# Patient Record
Sex: Male | Born: 1940 | Race: White | Hispanic: No | Marital: Married | Smoking: Never smoker
Health system: Southern US, Community
[De-identification: ages and names within clinical notes are randomized; demographics above are authoritative.]

## PROBLEM LIST (undated history)

## (undated) DIAGNOSIS — J310 Chronic rhinitis: Secondary | ICD-10-CM

## (undated) DIAGNOSIS — K469 Unspecified abdominal hernia without obstruction or gangrene: Secondary | ICD-10-CM

## (undated) DIAGNOSIS — L309 Dermatitis, unspecified: Secondary | ICD-10-CM

## (undated) DIAGNOSIS — E785 Hyperlipidemia, unspecified: Secondary | ICD-10-CM

## (undated) DIAGNOSIS — E039 Hypothyroidism, unspecified: Secondary | ICD-10-CM

## (undated) HISTORY — PX: SHOULDER SURGERY: SHX246

## (undated) HISTORY — PX: KNEE ARTHROSCOPY: SUR90

## (undated) HISTORY — DX: Unspecified abdominal hernia without obstruction or gangrene: K46.9

## (undated) HISTORY — DX: Chronic rhinitis: J31.0

## (undated) HISTORY — PX: MECKEL DIVERTICULUM EXCISION: SHX314

## (undated) HISTORY — DX: Hyperlipidemia, unspecified: E78.5

## (undated) HISTORY — DX: Hypothyroidism, unspecified: E03.9

## (undated) HISTORY — PX: OTHER SURGICAL HISTORY: SHX169

## (undated) HISTORY — DX: Dermatitis, unspecified: L30.9

## (undated) HISTORY — PX: BUNIONECTOMY: SHX129

---

## 1998-02-26 ENCOUNTER — Emergency Department (HOSPITAL_COMMUNITY): Admission: EM | Admit: 1998-02-26 | Discharge: 1998-02-26 | Payer: Self-pay | Admitting: Emergency Medicine

## 2002-01-25 ENCOUNTER — Encounter: Payer: Self-pay | Admitting: Orthopaedic Surgery

## 2002-01-25 ENCOUNTER — Ambulatory Visit (HOSPITAL_COMMUNITY): Admission: RE | Admit: 2002-01-25 | Discharge: 2002-01-25 | Payer: Self-pay | Admitting: Orthopaedic Surgery

## 2002-03-23 ENCOUNTER — Ambulatory Visit (HOSPITAL_BASED_OUTPATIENT_CLINIC_OR_DEPARTMENT_OTHER): Admission: RE | Admit: 2002-03-23 | Discharge: 2002-03-23 | Payer: Self-pay | Admitting: Orthopaedic Surgery

## 2003-09-27 ENCOUNTER — Encounter: Admission: RE | Admit: 2003-09-27 | Discharge: 2003-09-27 | Payer: Self-pay | Admitting: Internal Medicine

## 2003-10-25 ENCOUNTER — Encounter: Admission: RE | Admit: 2003-10-25 | Discharge: 2003-10-25 | Payer: Self-pay | Admitting: Internal Medicine

## 2003-10-27 ENCOUNTER — Encounter: Admission: RE | Admit: 2003-10-27 | Discharge: 2003-10-27 | Payer: Self-pay | Admitting: Internal Medicine

## 2007-07-30 ENCOUNTER — Encounter: Admission: RE | Admit: 2007-07-30 | Discharge: 2007-07-30 | Payer: Self-pay | Admitting: Internal Medicine

## 2008-04-26 ENCOUNTER — Encounter (HOSPITAL_COMMUNITY): Admission: RE | Admit: 2008-04-26 | Discharge: 2008-05-10 | Payer: Self-pay | Admitting: Internal Medicine

## 2010-09-28 NOTE — Op Note (Signed)
NAME:  Devon Jacobs, Devon Jacobs NO.:  [id number]   MEDICAL RECORD NO.:  [id number]                   PATIENT TYPE:   LOCATION:                                       FACILITY:   PHYSICIAN:  Lubertha Basque. Jerl Santos, M.D.             DATE OF BIRTH:  11-03-1940   DATE OF PROCEDURE:  03/23/2002  DATE OF DISCHARGE:                                 OPERATIVE REPORT   PREOPERATIVE DIAGNOSES:  1. Left knee torn medial and torn lateral meniscus.  2. Left knee degenerative joint disease.   POSTOPERATIVE DIAGNOSES:  1. Left knee torn medial and torn lateral meniscus.  2. Left knee degenerative joint disease.   PROCEDURE:  1. Left knee partial medial and partial lateral meniscectomies.  2. Left knee chondroplasty, all three compartments.   ANESTHESIA:  Knee block MAC.   ATTENDING SURGEON:  Lubertha Basque. Jerl Santos, M.D.   ASSISTANT:  Lindwood Qua, P.A.   INDICATIONS FOR PROCEDURE:  The patient is a 70 year-old man with a long  history of left knee pain.  This has persisted despite oral anti-  inflammatories and injections.  He has undergone an MRI scan which shows  complex tears of both menisci and significant degenerative changes in all  three compartments.  With pain that limits his activity and rest he is  offered an arthroscopy.  The procedure was discussed with the patient and  informed operative consent was obtained after discussing the possible  complications of reaction to anesthesia, infection and the probable need for  additional surgery.   DESCRIPTION OF PROCEDURE:  The patient was taken to the operating suite  where a knee block was applied without difficulty.  He was positioned supine  and prepped and draped in normal sterile fashion.  After the administration  of IV antibiotics an arthroscopy of the left knee was performed through a  total of two inferiori portals.  The patient stayed awake throughout the  case and observed the entire operation. In the  suprapatellar pouch he had  some cartilaginous loose bodies removed.  The patellofemoral joint was  notable for some grade 4 degeneration mostly involving the patellar surface.  A thorough chondroplasty was done here.  In the medial compartment he had a  posterior and medial horn medial meniscus tear addressed with a 5% partial  medial meniscectomy back to stable tissues.  Grade 4 change and grade 3  change with some large loose flaps which required chondroplasty mostly on  the medial femoral condyle.  He had some displaced meniscal fragments in the  notch which were removed.  The ACL below appeared to be relatively intact.  A large spur on the anterior aspect of the intertrochlear area.  In the  lateral compartment he had a complex tear of the middle and posterior horns  addressed with a 20% partial lateral meniscectomy. A good portion of this  was calcified and was  removed like a loose body.  He had grade 4 change in  this compartment throughout most of the bony surfaces.  A thorough  chondroplasty was done of some residual rough articular cartilage.  The knee  was thoroughly irrigated at the end of the case followed by placement of  Marcaine with epinephrine and morphine plus Depo-Medrol. Estimated blood  loss and intraoperative fluids can be obtained from the anesthesia records.   DISPOSITION:  The patient was taken to the recovery room in stable  condition.  Plans were for him to go home the same day and to follow-up in  the office in less than a week. I will contact him by phone tonight.                                               Lubertha Basque Jerl Santos, M.D.    PGD/MEDQ  D:  03/23/2002  T:  03/23/2002  Job:  [id number]

## 2010-11-16 ENCOUNTER — Encounter (INDEPENDENT_AMBULATORY_CARE_PROVIDER_SITE_OTHER): Payer: Self-pay | Admitting: General Surgery

## 2010-11-26 ENCOUNTER — Ambulatory Visit (INDEPENDENT_AMBULATORY_CARE_PROVIDER_SITE_OTHER): Payer: Medicare Other | Admitting: General Surgery

## 2010-11-26 ENCOUNTER — Encounter (INDEPENDENT_AMBULATORY_CARE_PROVIDER_SITE_OTHER): Payer: Self-pay | Admitting: General Surgery

## 2010-11-26 VITALS — BP 158/98 | HR 72 | Temp 97.0°F | Ht 68.0 in | Wt 212.4 lb

## 2010-11-26 DIAGNOSIS — K4020 Bilateral inguinal hernia, without obstruction or gangrene, not specified as recurrent: Secondary | ICD-10-CM

## 2010-11-26 NOTE — Progress Notes (Signed)
Subjective:     Patient ID: Devon Jacobs, male   DOB: [birth year], 70 y.o.   MRN: [id number]  HPI This patient is a healthy 70 year old gentleman, referred to me by Dr. Theressa Jacobs for evaluation of a symptomatic right inguinal hernia. He wonders if he has a small hernia on the left side. He states that he noticed a bulge in his right groin about 3 months ago. It has been getting larger and he has a burning discomfort. He has never had any hernia  Surgery before. Significant historyi ncludes a laparotomy through a lower midline incision for a ruptured Meckel's diverticulitis Dr. Joanne Jacobs may years ago.  Review of Systems  Constitutional: Negative.   HENT: Negative.   Eyes: Negative.   Respiratory: Negative.   Cardiovascular: Negative.   Gastrointestinal: Negative.   Genitourinary: Negative.   Musculoskeletal: Negative.   Neurological: Negative.   Hematological: Negative.    Past Medical History  Diagnosis Date  . Rhinitis   . Hyperlipidemia   . Eczema   . Grave's disease   . Hernia   . Hypothyroid     . Current Outpatient Prescriptions  Medication Sig Dispense Refill  . cetirizine (ZYRTEC) 10 MG tablet Take 10 mg by mouth daily.        [name] levothyroxine (SYNTHROID, LEVOTHROID) 125 MCG tablet Take 125 mcg by mouth daily.        No Known Allergies History reviewed. No pertinent family history. History  Substance Use Topics  . Smoking status: Never Smoker   . Smokeless tobacco: Not on file  . Alcohol Use: 1.2 oz/week    2 Glasses of wine per week   Objective:   Physical Exam  Constitutional: He is oriented to person, place, and time. He appears well-developed and well-nourished. No distress.  HENT:  Head: Normocephalic and atraumatic.  Eyes: Conjunctivae are normal. Pupils are equal, round, and reactive to light. Left eye exhibits no discharge. No scleral icterus.  Neck: Normal range of motion. No JVD present. No tracheal deviation present. No thyromegaly present.    Cardiovascular: Normal rate and regular rhythm.  Exam reveals no gallop.   No murmur heard. Pulmonary/Chest: Breath sounds normal. No respiratory distress. He has no rales.  Abdominal: Soft. Bowel sounds are normal.    Genitourinary: Penis normal.    No penile tenderness.  Musculoskeletal: He exhibits no edema and no tenderness.  Lymphadenopathy:    He has no cervical adenopathy.  Neurological: He is alert and oriented to person, place, and time. Coordination normal.  Skin: Skin is warm and dry. No rash noted.  Psychiatric: He has a normal mood and affect. His behavior is normal. Judgment and thought content normal.       Assessment:     1 bilateral inguinal hernias. The right side is much larger than the left but both are present. The right side is symptomatic. 2. Hypothyroidism on levothyroxine.    Plan:     .Scheduled for elective repair of his bilateral inguinal hernias. He is not a candidate for laparoscopic repair because of the lower midline incision. I have discussed the indications and details of the surgery with him. Risks and complications have been outlined, including but not limited to bleeding, infection, recurrence of the hernia, injury to adjacent organs such as the testicle or bladder or intestine with reconstructive surgery, nerve damage with chronic pain, and other  problems. He seems to understand all of these issues well. At this time all of his questions  were answered. He is in full agreement with this plan. We will schedule this for an overnight stay as an outpatient the valve.

## 2010-11-26 NOTE — Patient Instructions (Signed)
We have found that you have bilateral inguinal hernias. The right side is much larger than the left. We have decided to proceed with scheduling of open repair of your bilateral inguinal hernias with mesh My office will schedule the surgery and give you all of your preoperative instructions.

## 2010-11-28 ENCOUNTER — Telehealth (INDEPENDENT_AMBULATORY_CARE_PROVIDER_SITE_OTHER): Payer: Self-pay | Admitting: General Surgery

## 2010-11-28 NOTE — Telephone Encounter (Signed)
Error see previous note .  °

## 2010-11-28 NOTE — Telephone Encounter (Signed)
Devon Jacobs called today and ask if surgery could be moved up//He was seen 11-27-10 with dr Derrell Lolling for bil hernias/  He stated he felt one of hernias had buldged a little more last night but is some better today// I  told him I  would let schedulers try move up but if his symptoms changed he needed  call back or go to er if he could not get off his feet ,lay flat and the hernia area feel better//07/18/12[place]heath

## 2010-12-06 ENCOUNTER — Telehealth (INDEPENDENT_AMBULATORY_CARE_PROVIDER_SITE_OTHER): Payer: Self-pay | Admitting: General Surgery

## 2010-12-06 NOTE — Telephone Encounter (Signed)
Pts wife called wanting to know if pt will be able to walk up steps to bedroom after hernia repair. I advised her that house steps will be fine. He may be a bit sore and slow but should not have any problems climbing house steps.

## 2010-12-07 DIAGNOSIS — K4020 Bilateral inguinal hernia, without obstruction or gangrene, not specified as recurrent: Secondary | ICD-10-CM

## 2010-12-08 ENCOUNTER — Emergency Department (HOSPITAL_COMMUNITY)
Admission: EM | Admit: 2010-12-08 | Discharge: 2010-12-08 | Disposition: A | Discharge status: Home / Self Care | Payer: Medicare Other | Attending: Emergency Medicine | Admitting: Emergency Medicine

## 2010-12-08 DIAGNOSIS — R339 Retention of urine, unspecified: Secondary | ICD-10-CM | POA: Insufficient documentation

## 2010-12-08 DIAGNOSIS — Z9889 Other specified postprocedural states: Secondary | ICD-10-CM | POA: Insufficient documentation

## 2010-12-08 LAB — URINALYSIS, ROUTINE W REFLEX MICROSCOPIC
Bilirubin Urine: NEGATIVE
Glucose, UA: 500 mg/dL — AB
Protein, ur: NEGATIVE mg/dL
Specific Gravity, Urine: 1.018 (ref 1.005–1.030)
Urobilinogen, UA: 0.2 mg/dL (ref 0.0–1.0)

## 2010-12-11 ENCOUNTER — Ambulatory Visit (INDEPENDENT_AMBULATORY_CARE_PROVIDER_SITE_OTHER): Payer: Medicare Other | Admitting: General Surgery

## 2010-12-11 DIAGNOSIS — Z8719 Personal history of other diseases of the digestive system: Secondary | ICD-10-CM

## 2010-12-11 NOTE — Progress Notes (Signed)
Patient post bilateral inguinal hernia repair.  Here for urinary catheter removal.  Patient states he wants to want a little longer as he still has swelling from surgery. Dr. Derrell Lolling asked for direction - Dr. Derrell Lolling states to leave catheter in for 48 more hours and to give patient a script for Flomax to take before catheter is removed but to discontinue Flomax post catheter removal.  Patient understands. Appointment made for nurse only Thursday 12/13/10

## 2010-12-11 NOTE — Progress Notes (Deleted)
Subjective:     Patient ID: Devon Jacobs, male   DOB: 11/17/1940, 70 y.o.   MRN: [id number]  HPI   Review of Systems     Objective:   Physical Exam     Assessment:     ***    Plan:     ***

## 2010-12-13 ENCOUNTER — Telehealth (INDEPENDENT_AMBULATORY_CARE_PROVIDER_SITE_OTHER): Payer: Self-pay

## 2010-12-13 ENCOUNTER — Ambulatory Visit (INDEPENDENT_AMBULATORY_CARE_PROVIDER_SITE_OTHER): Payer: Medicare Other

## 2010-12-13 NOTE — Progress Notes (Signed)
Foley catheter removed via orders from Dr Derrell Lolling.  Patient forgot to take Flomax - instructed to not have prescription filled as the medication was to be taken prior to foley removal. Patient instructed on reestablishing voiding and what would be qs voiding. Transverse incision site from surgery is intact with surgical glue.  Patient encouraged to call with any questions/concerns.

## 2010-12-13 NOTE — Telephone Encounter (Signed)
Pt called stating he was urinating well when cath was removed today. He has started to notice that is stream seems to be slowing. He is still voiding. Pt states he still has the flomax that Dr Derrell Lolling wrote him when the cath was placed but he has not been taking it. I advised pt to take the flomax if he was directed to do so by Dr Derrell Lolling. I also advised pt to measure his urine out put and if it continues to decline or if he has pelvic/ bladder pain and or distention then pt needs to go to [facility] for cath to be reinserted and to call our office  at 8:30 am for ref. To urologist. Pt states he understands.

## 2011-01-16 ENCOUNTER — Ambulatory Visit (INDEPENDENT_AMBULATORY_CARE_PROVIDER_SITE_OTHER): Payer: Medicare Other | Admitting: General Surgery

## 2011-01-16 ENCOUNTER — Encounter (INDEPENDENT_AMBULATORY_CARE_PROVIDER_SITE_OTHER): Payer: Self-pay | Admitting: General Surgery

## 2011-01-16 VITALS — BP 138/88 | HR 80 | Temp 97.4°F

## 2011-01-16 DIAGNOSIS — Z9889 Other specified postprocedural states: Secondary | ICD-10-CM

## 2011-01-16 NOTE — Patient Instructions (Signed)
From the standpoint of your inguinal hernia surgery, you may resume  all normal physical activities without restriction. Stretch  before and after exercise and work your way back to normal activities. Return to see me as needed, anytime if there is a concern. Continue followup with Dr. Annabell Howells regarding your prostate enlargement.

## 2011-01-16 NOTE — Progress Notes (Signed)
Subjective:     Patient ID: Devon Jacobs, male   DOB: Dec 24, 1940, 70 y.o.   MRN: [id number]  HPI Mr. Devon Jacobs has now completely recovered from his bilateral inguinal hernia repair, but he is still in urinary retention, requires a Foley catheter, and is followed by Dr. Bjorn Pippin. He states that he has an enlarged prostate. He is on different medication now.  In terms of his hernias he states that that has now healed. He has no pain. Just a little bit of puffiness. Wants to resume  full activities.  Review of Systems     Objective:   Physical Exam The patient looks well. His wife is with him.  Abdomen- soft and nontender no mass  Genitourinary- both groin incisions are well-healed. The hernia repairs are solid and intact. He is examined supine and standing. Penis and testes are normal, other than the indwelling Foley catheter.    Assessment:     Status post repair of bilateral inguinal hernias. No direct surgical complication.  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 Annabell Howells    Plan:     I told him that he may resume all physical activities without restriction. He may return to see me as needed.  He will follow up with Dr. Annabell Howells  regarding his urinary retention and benign prostatic hypertrophy.

## 2011-09-19 ENCOUNTER — Other Ambulatory Visit: Payer: Self-pay | Admitting: Internal Medicine

## 2011-09-19 DIAGNOSIS — R221 Localized swelling, mass and lump, neck: Secondary | ICD-10-CM

## 2011-09-23 ENCOUNTER — Ambulatory Visit
Admission: RE | Admit: 2011-09-23 | Discharge: 2011-09-23 | Disposition: A | Discharge status: Home / Self Care | Payer: Medicare Other | Source: Ambulatory Visit | Attending: Internal Medicine | Admitting: Internal Medicine

## 2011-09-23 DIAGNOSIS — R221 Localized swelling, mass and lump, neck: Secondary | ICD-10-CM

## 2011-09-23 MED ORDER — IOHEXOL 300 MG/ML  SOLN
75.0000 mL | Freq: Once | INTRAMUSCULAR | Status: AC | PRN
  Administered 2011-09-23: 75 mL via INTRAVENOUS

## 2011-09-26 ENCOUNTER — Other Ambulatory Visit (HOSPITAL_COMMUNITY)
Admission: RE | Admit: 2011-09-26 | Discharge: 2011-09-26 | Disposition: A | Discharge status: Home / Self Care | Payer: Medicare Other | Source: Ambulatory Visit | Attending: Otolaryngology | Admitting: Otolaryngology

## 2011-09-26 ENCOUNTER — Other Ambulatory Visit: Payer: Self-pay | Admitting: Otolaryngology

## 2011-09-26 DIAGNOSIS — D119 Benign neoplasm of major salivary gland, unspecified: Secondary | ICD-10-CM | POA: Insufficient documentation

## 2011-11-20 ENCOUNTER — Encounter (HOSPITAL_BASED_OUTPATIENT_CLINIC_OR_DEPARTMENT_OTHER): Payer: Self-pay | Admitting: *Deleted

## 2011-11-20 NOTE — Progress Notes (Signed)
To bring all meds-overnight bag 

## 2011-11-25 ENCOUNTER — Encounter (HOSPITAL_BASED_OUTPATIENT_CLINIC_OR_DEPARTMENT_OTHER): Payer: Self-pay

## 2011-11-25 ENCOUNTER — Encounter (HOSPITAL_BASED_OUTPATIENT_CLINIC_OR_DEPARTMENT_OTHER): Payer: Self-pay | Admitting: Anesthesiology

## 2011-11-25 ENCOUNTER — Ambulatory Visit (HOSPITAL_BASED_OUTPATIENT_CLINIC_OR_DEPARTMENT_OTHER): Payer: Medicare Other | Admitting: Anesthesiology

## 2011-11-25 ENCOUNTER — Encounter (HOSPITAL_BASED_OUTPATIENT_CLINIC_OR_DEPARTMENT_OTHER): Payer: Self-pay | Admitting: Certified Registered"

## 2011-11-25 ENCOUNTER — Ambulatory Visit (HOSPITAL_BASED_OUTPATIENT_CLINIC_OR_DEPARTMENT_OTHER)
Admission: RE | Admit: 2011-11-25 | Discharge: 2011-11-26 | Disposition: A | Discharge status: Home / Self Care | Payer: Medicare Other | Source: Ambulatory Visit | Attending: Otolaryngology | Admitting: Otolaryngology

## 2011-11-25 ENCOUNTER — Encounter (HOSPITAL_BASED_OUTPATIENT_CLINIC_OR_DEPARTMENT_OTHER)
Admission: RE | Disposition: A | Discharge status: Home / Self Care | Payer: Self-pay | Source: Ambulatory Visit | Attending: Otolaryngology

## 2011-11-25 DIAGNOSIS — D119 Benign neoplasm of major salivary gland, unspecified: Secondary | ICD-10-CM | POA: Insufficient documentation

## 2011-11-25 DIAGNOSIS — E039 Hypothyroidism, unspecified: Secondary | ICD-10-CM | POA: Insufficient documentation

## 2011-11-25 DIAGNOSIS — K118 Other diseases of salivary glands: Secondary | ICD-10-CM

## 2011-11-25 DIAGNOSIS — E785 Hyperlipidemia, unspecified: Secondary | ICD-10-CM | POA: Insufficient documentation

## 2011-11-25 HISTORY — PX: PAROTIDECTOMY: SHX2163

## 2011-11-25 SURGERY — EXCISION, PAROTID GLAND
Anesthesia: General | Site: Face | Laterality: Right | Wound class: Clean

## 2011-11-25 MED ORDER — PROMETHAZINE HCL 25 MG RE SUPP: 25.0000 mg | Freq: Four times a day (QID) | RECTAL | Status: DC | PRN

## 2011-11-25 MED ORDER — LEVOTHYROXINE SODIUM 125 MCG PO TABS: 125.0000 ug | ORAL_TABLET | Freq: Every day | ORAL | Status: DC

## 2011-11-25 MED ORDER — METOCLOPRAMIDE HCL 5 MG/ML IJ SOLN
INTRAMUSCULAR | Status: DC | PRN
  Administered 2011-11-25: 10 mg via INTRAVENOUS

## 2011-11-25 MED ORDER — LACTATED RINGERS IV SOLN
INTRAVENOUS | Status: DC
  Administered 2011-11-25 (×2): via INTRAVENOUS

## 2011-11-25 MED ORDER — HYDROCODONE-ACETAMINOPHEN 7.5-500 MG PO TABS: 1.0000 | ORAL_TABLET | Freq: Four times a day (QID) | ORAL | Status: DC | PRN

## 2011-11-25 MED ORDER — ARTIFICIAL TEARS OP OINT: TOPICAL_OINTMENT | Freq: Every evening | OPHTHALMIC | Status: DC | PRN

## 2011-11-25 MED ORDER — PROMETHAZINE HCL 25 MG PO TABS
25.0000 mg | ORAL_TABLET | Freq: Four times a day (QID) | ORAL | Status: DC | PRN
  Administered 2011-11-25: 12.5 mg via ORAL

## 2011-11-25 MED ORDER — IBUPROFEN 100 MG/5ML PO SUSP
400.0000 mg | Freq: Four times a day (QID) | ORAL | Status: DC | PRN
  Administered 2011-11-25: 400 mg via ORAL

## 2011-11-25 MED ORDER — FENTANYL CITRATE 0.05 MG/ML IJ SOLN
INTRAMUSCULAR | Status: DC | PRN
  Administered 2011-11-25: 50 ug via INTRAVENOUS
  Administered 2011-11-25: 100 ug via INTRAVENOUS
  Administered 2011-11-25: 25 ug via INTRAVENOUS
  Administered 2011-11-25: 50 ug via INTRAVENOUS

## 2011-11-25 MED ORDER — MIDAZOLAM HCL 5 MG/5ML IJ SOLN
INTRAMUSCULAR | Status: DC | PRN
  Administered 2011-11-25: 1 mg via INTRAVENOUS

## 2011-11-25 MED ORDER — OXYCODONE HCL 5 MG PO TABS: 5.0000 mg | ORAL_TABLET | Freq: Once | ORAL | Status: AC | PRN

## 2011-11-25 MED ORDER — DEXTROSE-NACL 5-0.9 % IV SOLN
INTRAVENOUS | Status: DC
  Administered 2011-11-25 (×2): via INTRAVENOUS

## 2011-11-25 MED ORDER — SUCCINYLCHOLINE CHLORIDE 20 MG/ML IJ SOLN
INTRAMUSCULAR | Status: DC | PRN
  Administered 2011-11-25: 100 mg via INTRAVENOUS

## 2011-11-25 MED ORDER — METOCLOPRAMIDE HCL 5 MG/ML IJ SOLN: 10.0000 mg | Freq: Once | INTRAMUSCULAR | Status: AC | PRN

## 2011-11-25 MED ORDER — PROPOFOL 10 MG/ML IV EMUL
INTRAVENOUS | Status: DC | PRN
  Administered 2011-11-25: 200 mg via INTRAVENOUS
  Administered 2011-11-25: 50 mg via INTRAVENOUS

## 2011-11-25 MED ORDER — LIDOCAINE HCL (CARDIAC) 20 MG/ML IV SOLN
INTRAVENOUS | Status: DC | PRN
  Administered 2011-11-25: 100 mg via INTRAVENOUS

## 2011-11-25 MED ORDER — HYDROCODONE-ACETAMINOPHEN 5-325 MG PO TABS: 1.0000 | ORAL_TABLET | ORAL | Status: DC | PRN

## 2011-11-25 MED ORDER — HYDROMORPHONE HCL PF 1 MG/ML IJ SOLN
0.2500 mg | INTRAMUSCULAR | Status: DC | PRN
  Administered 2011-11-25 (×2): 0.25 mg via INTRAVENOUS

## 2011-11-25 MED ORDER — LORATADINE 10 MG PO TABS: 10.0000 mg | ORAL_TABLET | Freq: Every day | ORAL | Status: DC

## 2011-11-25 MED ORDER — EPHEDRINE SULFATE 50 MG/ML IJ SOLN
INTRAMUSCULAR | Status: DC | PRN
  Administered 2011-11-25: 10 mg via INTRAVENOUS

## 2011-11-25 MED ORDER — SCOPOLAMINE 1 MG/3DAYS TD PT72: 1.0000 | MEDICATED_PATCH | Freq: Once | TRANSDERMAL | Status: DC

## 2011-11-25 MED ORDER — ACETAMINOPHEN 10 MG/ML IV SOLN
1000.0000 mg | Freq: Once | INTRAVENOUS | Status: AC
  Administered 2011-11-25: 1000 mg via INTRAVENOUS

## 2011-11-25 MED ORDER — CEFAZOLIN SODIUM-DEXTROSE 2-3 GM-% IV SOLR
2.0000 g | INTRAVENOUS | Status: AC
  Administered 2011-11-25: 2 g via INTRAVENOUS

## 2011-11-25 MED ORDER — ONDANSETRON HCL 4 MG/2ML IJ SOLN
INTRAMUSCULAR | Status: DC | PRN
  Administered 2011-11-25: 4 mg via INTRAVENOUS

## 2011-11-25 MED ORDER — DEXAMETHASONE SODIUM PHOSPHATE 4 MG/ML IJ SOLN
INTRAMUSCULAR | Status: DC | PRN
  Administered 2011-11-25: 10 mg via INTRAVENOUS

## 2011-11-25 SURGICAL SUPPLY — 65 items
1% LIDOCAINE W/EPI 1:100,000 - 20 ML IMPLANT
ATTRACTOMAT 16X20 MAGNETIC DRP (DRAPES) ×2 IMPLANT
BENZOIN TINCTURE PRP APPL 2/3 (GAUZE/BANDAGES/DRESSINGS) IMPLANT
BLADE SURG 15 STRL LF DISP TIS (BLADE) ×1 IMPLANT
BLADE SURG 15 STRL SS (BLADE) ×1
CANISTER SUCTION 1200CC (MISCELLANEOUS) ×2 IMPLANT
CLEANER CAUTERY TIP 5X5 PAD (MISCELLANEOUS) ×1 IMPLANT
CLOTH BEACON ORANGE TIMEOUT ST (SAFETY) ×2 IMPLANT
CORDS BIPOLAR (ELECTRODE) ×2 IMPLANT
COVER MAYO STAND STRL (DRAPES) ×2 IMPLANT
COVER TABLE BACK 60X90 (DRAPES) ×2 IMPLANT
DERMABOND ADVANCED (GAUZE/BANDAGES/DRESSINGS) ×1
DERMABOND ADVANCED .7 DNX12 (GAUZE/BANDAGES/DRESSINGS) ×1 IMPLANT
DRAIN JACKSON RD 7FR 3/32 (WOUND CARE) ×2 IMPLANT
DRAIN TLS ROUND 10FR (DRAIN) IMPLANT
DRAPE INCISE 23X17 IOBAN STRL (DRAPES) ×1
DRAPE INCISE IOBAN 23X17 STRL (DRAPES) ×1 IMPLANT
DRAPE U-SHAPE 76X120 STRL (DRAPES) ×2 IMPLANT
ELECT COATED BLADE 2.86 ST (ELECTRODE) ×2 IMPLANT
ELECT REM PT RETURN 9FT ADLT (ELECTROSURGICAL) ×2
ELECTRODE REM PT RTRN 9FT ADLT (ELECTROSURGICAL) ×1 IMPLANT
EVACUATOR SILICONE 100CC (DRAIN) ×2 IMPLANT
GAUZE SPONGE 4X4 12PLY STRL LF (GAUZE/BANDAGES/DRESSINGS) IMPLANT
GAUZE SPONGE 4X4 16PLY XRAY LF (GAUZE/BANDAGES/DRESSINGS) ×2 IMPLANT
GLOVE BIO SURGEON STRL SZ 6.5 (GLOVE) ×2 IMPLANT
GLOVE BIO SURGEON STRL SZ7 (GLOVE) IMPLANT
GLOVE BIO SURGEON STRL SZ7.5 (GLOVE) IMPLANT
GLOVE BIOGEL M 7.0 STRL (GLOVE) ×2 IMPLANT
GLOVE BIOGEL PI IND STRL 7.5 (GLOVE) ×1 IMPLANT
GLOVE BIOGEL PI IND STRL 8 (GLOVE) IMPLANT
GLOVE BIOGEL PI INDICATOR 7.5 (GLOVE) ×1
GLOVE BIOGEL PI INDICATOR 8 (GLOVE)
GLOVE ECLIPSE 7.5 STRL STRAW (GLOVE) ×2 IMPLANT
GLOVE ECLIPSE 8.0 STRL XLNG CF (GLOVE) ×2 IMPLANT
GLOVE SS BIOGEL STRL SZ 7.5 (GLOVE) IMPLANT
GLOVE SUPERSENSE BIOGEL SZ 7.5 (GLOVE)
GOWN PREVENTION PLUS XLARGE (GOWN DISPOSABLE) ×4 IMPLANT
GOWN PREVENTION PLUS XXLARGE (GOWN DISPOSABLE) ×2 IMPLANT
LOCATOR NERVE 3 VOLT (DISPOSABLE) ×2 IMPLANT
NEEDLE 27GAX1X1/2 (NEEDLE) IMPLANT
NEEDLE HYPO 25X1 1.5 SAFETY (NEEDLE) ×2 IMPLANT
NS IRRIG 1000ML POUR BTL (IV SOLUTION) ×2 IMPLANT
PACK BASIN DAY SURGERY FS (CUSTOM PROCEDURE TRAY) ×2 IMPLANT
PAD CLEANER CAUTERY TIP 5X5 (MISCELLANEOUS) ×1
PENCIL FOOT CONTROL (ELECTRODE) ×2 IMPLANT
PIN SAFETY STERILE (MISCELLANEOUS) ×2 IMPLANT
SHEARS HARMONIC 9CM CVD (BLADE) ×2 IMPLANT
SHEET MEDIUM DRAPE 40X70 STRL (DRAPES) IMPLANT
SLEEVE SCD COMPRESS KNEE MED (MISCELLANEOUS) ×2 IMPLANT
STAPLER VISISTAT 35W (STAPLE) IMPLANT
STRIP CLOSURE SKIN 1/2X4 (GAUZE/BANDAGES/DRESSINGS) IMPLANT
SUCTION FRAZIER TIP 10 FR DISP (SUCTIONS) IMPLANT
SUT CHROMIC 3 0 PS 2 (SUTURE) ×2 IMPLANT
SUT ETHILON 3 0 PS 1 (SUTURE) ×2 IMPLANT
SUT ETHILON 5 0 P 3 18 (SUTURE)
SUT NYLON ETHILON 5-0 P-3 1X18 (SUTURE) IMPLANT
SUT PLAIN 5 0 P 3 18 (SUTURE) IMPLANT
SUT SILK 3 0 PS 1 (SUTURE) ×4 IMPLANT
SUT SILK 3 0 SH CR/8 (SUTURE) IMPLANT
SUT SILK 4 0 TIES 17X18 (SUTURE) ×2 IMPLANT
SYR BULB 3OZ (MISCELLANEOUS) ×2 IMPLANT
SYR CONTROL 10ML LL (SYRINGE) ×2 IMPLANT
TOWEL OR 17X24 6PK STRL BLUE (TOWEL DISPOSABLE) ×4 IMPLANT
TUBE CONNECTING 20X1/4 (TUBING) ×2 IMPLANT
WATER STERILE IRR 1000ML POUR (IV SOLUTION) IMPLANT

## 2011-11-25 NOTE — Op Note (Signed)
OPERATIVE REPORT  DATE OF SURGERY: 11/25/2011  PATIENT:  Devon Jacobs,  71 y.o. male  PRE-OPERATIVE DIAGNOSIS:  Right parotid mass  POST-OPERATIVE DIAGNOSIS:  Right parotid mass  PROCEDURE:  Procedure(s): PAROTIDECTOMY  SURGEON:  Susy Frizzle, MD  ASSISTANTS: Flo Shanks, MD   ANESTHESIA:   general  EBL:  10 ml  DRAINS: (7 [place] round) Jackson-Pratt drain(s) with closed bulb suction in the Right parotid bed   LOCAL MEDICATIONS USED:  NONE  SPECIMEN:  Source of Specimen:  Right parotid lobe  COUNTS:  YES  PROCEDURE DETAILS: Patient was taken to the operating room and placed on the operating table in the supine position. Following induction of general endotracheal anesthesia, the right side of the face was prepped and draped in a standard fashion. A preauricular incision was outlined with a marking pen with extension around the earlobe-year-old and then around the mastoid. Electrocautery was used to incise the skin. The flap was developed anteriorly staying superficial to the parotid fascia. The posterior branch of the greater regular nerve was identified and was dissected out inferiorly and selected posteriorly away from the gland. The gland was brought forward off of the sternocleidomastoid muscle. The preauricular dissection was continued down anterior to the ear canal cartilage. The tympanomastoid suture line was identified as was the posterior belly of the digastric muscle. The main trunk of the facial nerve was then identified. A McCabe dissector was then used to dissected the main trunk out to the pes anserinus. All the branches of the nerve were then dissected out peripherally using a McCabe dissector and the harmonic scalpel to divide the parotid tissue. The lateral lobe was removed with the tumor within, and a large amount of surrounding fibrosis and inflammatory type changes. Bipolar cautery was used for hemostasis completion. The wound was irrigated with saline. I nerve  stimulator was used to stimulate the main trunk and the upper and lower divisions and there is good movement. A 7 French round J-P drain was left in the wound and exited through the lower part of the incision, secured in place with silk suture. The incision was reapproximated with interrupted subcuticular chromic suture and Dermabond. The patient was then awakened, extubated and transferred to recovery in stable condition.   PATIENT DISPOSITION:  PACU - hemodynamically stable.

## 2011-11-25 NOTE — Progress Notes (Signed)
Day of Surgery  Subjective: Feels good.  Pain controlled.  Objective: Vital signs in last 24 hours: Temp:  [97.5 F (36.4 C)-98.1 F (36.7 C)] 98.1 F (36.7 C) (07/15 1530) Pulse Rate:  [66-84] 75  (07/15 1530) Resp:  [10-19] 16  (07/15 1530) BP: (149-175)/(75-103) 162/89 mmHg (07/15 1530) SpO2:  [85 %-100 %] 97 % (07/15 1530)    Intake/Output from previous day:   Intake/Output this shift: Total I/O In: 2015 [P.O.:715; I.V.:1300] Out: 925 [Urine:900; Drains:25]  General appearance: alert, cooperative and no distress Head: Right parotid incision clean and intact, no fluid collection, drain functioning.  Right facial movement somewhat weak, particularly lower divisions.  Able to close eye but with some lag.  Lab Results:   Basename 11/25/11 0744  WBC --  HGB 14.5  HCT --  PLT --   BMET No results found for this basename: NA:2,K:2,CL:2,CO2:2,GLUCOSE:2,BUN:2,CREATININE:2,CALCIUM:2 in the last 72 hours PT/INR No results found for this basename: LABPROT:2,INR:2 in the last 72 hours ABG No results found for this basename: PHART:2,PCO2:2,PO2:2,HCO3:2 in the last 72 hours  Studies/Results: No results found.  Anti-infectives: Anti-infectives     Start     Dose/Rate Route Frequency Ordered Stop   11/25/11 0728   ceFAZolin (ANCEF) IVPB 2 g/50 mL premix        2 g 100 mL/hr over 30 Minutes Intravenous 60 min pre-op 11/25/11 0728 11/25/11 0839          Assessment/Plan: s/p Procedure(s) (LRB): PAROTIDECTOMY (Right) Drain overnight.  Will have lacrilube applied in right eye at bedtime.  Anticipate discharge in morning.  LOS: 0 days    Shelbia Scinto 11/25/2011

## 2011-11-25 NOTE — Anesthesia Preprocedure Evaluation (Signed)
Anesthesia Evaluation  Patient identified by MRN, date of birth, ID band Patient awake    Reviewed: Allergy & Precautions, H&P , NPO status , Patient's Chart, lab work & pertinent test results, reviewed documented beta blocker date and time   Airway Mallampati: II TM Distance: >3 FB Neck ROM: full    Dental   Pulmonary neg pulmonary ROS,          Cardiovascular negative cardio ROS      Neuro/Psych negative neurological ROS  negative psych ROS   GI/Hepatic negative GI ROS, Neg liver ROS,   Endo/Other  Hypothyroidism   Renal/GU negative Renal ROS  negative genitourinary   Musculoskeletal   Abdominal   Peds  Hematology negative hematology ROS (+)   Anesthesia Other Findings See surgeon's H&P   Reproductive/Obstetrics negative OB ROS                           Anesthesia Physical Anesthesia Plan  ASA: II  Anesthesia Plan: General   Post-op Pain Management:    Induction: Intravenous  Airway Management Planned: Oral ETT  Additional Equipment:   Intra-op Plan:   Post-operative Plan: Extubation in OR  Informed Consent: I have reviewed the patients History and Physical, chart, labs and discussed the procedure including the risks, benefits and alternatives for the proposed anesthesia with the patient or authorized representative who has indicated his/her understanding and acceptance.   Dental Advisory Given  Plan Discussed with: CRNA and Surgeon  Anesthesia Plan Comments:         Anesthesia Quick Evaluation

## 2011-11-25 NOTE — H&P (Signed)
  Devon Jacobs is an 71 y.o. male.   Chief Complaint: Facial mass HPI: Several month history of parotid mass.  Past Medical History  Diagnosis Date  . Rhinitis   . Hyperlipidemia   . Eczema   . Hernia   . Hypothyroid     Past Surgical History  Procedure Date  . Shoulder surgery   . Bicep tendon rupture   . Left finger surgery   . Bunionectomy   . Knee arthroscopy   . Toe removal   . Meckel diverticulum excision   . Hernia repair 9/13    bilat ing hernias    History reviewed. No pertinent family history. Social History:  reports that he has never smoked. He does not have any smokeless tobacco history on file. He reports that he drinks about 1.2 ounces of alcohol per week. He reports that he does not use illicit drugs.  Allergies: No Known Allergies  Medications Prior to Admission  Medication Sig Dispense Refill  . cetirizine (ZYRTEC) 10 MG tablet Take 10 mg by mouth daily.        [name] levothyroxine (SYNTHROID, LEVOTHROID) 125 MCG tablet Take 125 mcg by mouth daily.          No results found for this or any previous visit (from the past 48 hour(s)). No results found.  ROS: otherwise negative  Blood pressure 171/91, pulse 66, temperature 97.7 F (36.5 C), temperature source Oral, resp. rate 18, height 5\' 9"  (1.753 m), weight 200 lb (90.719 kg), SpO2 96.00%.  PHYSICAL EXAM: Overall appearance:  Healthy appearing, in no distress Head:  Normocephalic, atraumatic. Ears: External auditory canals are clear; tympanic membranes are intact and the middle ears are free of any effusion. Nose: External nose is healthy in appearance. Internal nasal exam free of any lesions or obstruction. Oral Cavity:  There are no mucosal lesions or masses identified. Oral Pharynx/Hypopharynx/Larynx: no signs of any mucosal lesions or masses identified.  Neuro:  No identifiable neurologic deficits. Neck: No palpable neck masses. Right parotid mass.  Studies Reviewed:  none    Assessment/Plan Proceed with parotidectomy.  Devon Jacobs 11/25/2011, 7:26 AM

## 2011-11-25 NOTE — Progress Notes (Signed)
Pt up to bathroom frequently.states fells like he is not able to empty bladder.voided 100cc in urinal.14 french foley inserted without difficulty.750cc clear yellow urine returned.will leave in overnight. Dr Pollyann Kennedy to come in in am.

## 2011-11-25 NOTE — Transfer of Care (Signed)
Immediate Anesthesia Transfer of Care Note  Patient: Devon Jacobs  Procedure(s) Performed: Procedure(s) (LRB): PAROTIDECTOMY (Right)  Patient Location: PACU  Anesthesia Type: General  Level of Consciousness: awake, alert , oriented and patient cooperative  Airway & Oxygen Therapy: Patient Spontanous Breathing and Patient connected to face mask oxygen  Post-op Assessment: Report given to PACU RN and Post -op Vital signs reviewed and stable  Post vital signs: Reviewed and stable  Complications: No apparent anesthesia complications

## 2011-11-25 NOTE — Anesthesia Postprocedure Evaluation (Signed)
Anesthesia Post Note  Patient: Devon Jacobs  Procedure(s) Performed: Procedure(s) (LRB): PAROTIDECTOMY (Right)  Anesthesia type: General  Patient location: PACU  Post pain: Pain level controlled  Post assessment: Patient's Cardiovascular Status Stable  Last Vitals:  Filed Vitals:   11/25/11 1130  BP: 175/103  Pulse: 84  Temp: 36.4 C  Resp: 16    Post vital signs: Reviewed and stable  Level of consciousness: alert  Complications: No apparent anesthesia complications

## 2011-11-27 ENCOUNTER — Encounter (HOSPITAL_BASED_OUTPATIENT_CLINIC_OR_DEPARTMENT_OTHER): Payer: Self-pay | Admitting: Otolaryngology

## 2011-12-03 ENCOUNTER — Encounter (HOSPITAL_COMMUNITY): Payer: Self-pay | Admitting: *Deleted

## 2011-12-03 ENCOUNTER — Emergency Department (HOSPITAL_COMMUNITY)
Admission: EM | Admit: 2011-12-03 | Discharge: 2011-12-04 | Disposition: A | Discharge status: Home / Self Care | Payer: Medicare Other | Attending: Emergency Medicine | Admitting: Emergency Medicine

## 2011-12-03 DIAGNOSIS — E785 Hyperlipidemia, unspecified: Secondary | ICD-10-CM | POA: Insufficient documentation

## 2011-12-03 DIAGNOSIS — E039 Hypothyroidism, unspecified: Secondary | ICD-10-CM | POA: Insufficient documentation

## 2011-12-03 DIAGNOSIS — N368 Other specified disorders of urethra: Secondary | ICD-10-CM | POA: Insufficient documentation

## 2011-12-03 NOTE — ED Notes (Signed)
Pt had catheter placed post op last wk and developed bleeding from penis tonight; no blood noted in bag; minimal amt of urine noted; denies abd pain.  Catheter was due for removal tomorrow am; placed last wk due to inability to void post op last wk

## 2011-12-03 NOTE — ED Provider Notes (Signed)
History     CSN: [id number]  Arrival date & time 12/03/11  2148   First MD Initiated Contact with Patient 12/03/11 2309      Chief Complaint  Patient presents with  . Bleeding    HPI  History provided by the patient. Patient is a 71 year old male who presents with complaints of blood around his Foley catheter. Patient recently developed urinary retention following a product gland surgery procedure 1-2 weeks ago. Patient has had a catheter in place since that time but then noticed some small amounts of blood coming out of the penis around the catheter earlier today. Patient denies any specific trauma or pulling of the catheter. He denies any pain, swelling of the penis or rash. Patient denies having any recent fever, chills or sweats. Currently patient has no complaints. Patient is scheduled to have catheter removed at [facility] tomorrow at 10 AM.    Past Medical History  Diagnosis Date  . Rhinitis   . Hyperlipidemia   . Eczema   . Hernia   . Hypothyroid     Past Surgical History  Procedure Date  . Shoulder surgery   . Bicep tendon rupture   . Left finger surgery   . Bunionectomy   . Knee arthroscopy   . Toe removal   . Meckel diverticulum excision   . Hernia repair 9/13    bilat ing hernias  . Parotidectomy 11/25/2011    Procedure: PAROTIDECTOMY;  Surgeon: Serena Colonel, MD;  Location: [facility];  Service: ENT;  Laterality: Right;    No family history on file.  History  Substance Use Topics  . Smoking status: Never Smoker   . Smokeless tobacco: Not on file  . Alcohol Use: 1.2 oz/week    2 Glasses of wine per week      Review of Systems  Constitutional: Negative for fever.  Gastrointestinal: Negative for nausea, vomiting and abdominal pain.  Genitourinary: Negative for dysuria, flank pain, penile swelling, scrotal swelling, penile pain and testicular pain.    Allergies  Review of patient's allergies indicates no known allergies.  Home  Medications   Current Outpatient Rx  Name Route Sig Dispense Refill  . CETIRIZINE HCL 10 MG PO TABS Oral Take 10 mg by mouth daily.      [name] LEVOTHYROXINE SODIUM 125 MCG PO TABS Oral Take 125 mcg by mouth daily.        BP 177/95  Pulse 73  Temp 98.3 F (36.8 C)  Resp 20  SpO2 98%  Physical Exam  Nursing note and vitals reviewed. Constitutional: He is oriented to person, place, and time. He appears well-developed and well-nourished.  HENT:  Head: Normocephalic.  Cardiovascular: Normal rate and regular rhythm.   Pulmonary/Chest: Effort normal and breath sounds normal.  Abdominal: Soft. There is no tenderness.  Genitourinary: Penis normal.       No swelling of the penis. Normal testicles and scrotum. There is dry blood around catheter but no active bleeding.  Neurological: He is alert and oriented to person, place, and time.  Skin: Skin is warm.  Psychiatric: He has a normal mood and affect. His behavior is normal.    ED Course  Procedures     1. Urethral bleeding       MDM  Patient seen and evaluated. Patient well-appearing no complaints currently.  UA and urine culture sent. Patient has good followup tomorrow at 10 AM.        Angus Seller, PA 12/04/11  0005 

## 2011-12-04 LAB — URINALYSIS, ROUTINE W REFLEX MICROSCOPIC
Glucose, UA: NEGATIVE mg/dL
Protein, ur: 30 mg/dL — AB

## 2011-12-04 LAB — URINE MICROSCOPIC-ADD ON

## 2011-12-04 NOTE — ED Provider Notes (Signed)
Medical screening examination/treatment/procedure(s) were performed by non-physician practitioner and as supervising physician I was immediately available for consultation/collaboration.  Vaniyah Lansky M Javen Hinderliter, MD 12/04/11 0852 

## 2011-12-06 LAB — URINE CULTURE: Colony Count: 80000

## 2011-12-07 NOTE — ED Notes (Signed)
+   Urine Chart sent to EDP office for review. 

## 2011-12-09 NOTE — ED Notes (Signed)
Chart returned from EDP office. Prescribed Levofloxacin 250 mg, 1 PO daily x 5 days. #5. No refills.

## 2011-12-09 NOTE — ED Notes (Signed)
Left message with patient's wife for patient to call back.

## 2011-12-10 NOTE — ED Notes (Signed)
Spoke w/ pt.  Pt informed of dx and need for addl tx.  Rx call to [facility] [phone].

## 2012-01-12 HISTORY — PX: HERNIA REPAIR: SHX51

## 2015-06-17 ENCOUNTER — Emergency Department (HOSPITAL_COMMUNITY): Payer: Medicare Other

## 2015-06-17 ENCOUNTER — Emergency Department (HOSPITAL_COMMUNITY)
Admission: EM | Admit: 2015-06-17 | Discharge: 2015-06-17 | Disposition: A | Discharge status: Home / Self Care | Payer: Medicare Other | Attending: Emergency Medicine | Admitting: Emergency Medicine

## 2015-06-17 ENCOUNTER — Encounter (HOSPITAL_COMMUNITY): Payer: Self-pay | Admitting: Emergency Medicine

## 2015-06-17 DIAGNOSIS — E039 Hypothyroidism, unspecified: Secondary | ICD-10-CM | POA: Insufficient documentation

## 2015-06-17 DIAGNOSIS — G510 Bell's palsy: Secondary | ICD-10-CM | POA: Diagnosis not present

## 2015-06-17 DIAGNOSIS — R29810 Facial weakness: Secondary | ICD-10-CM | POA: Diagnosis present

## 2015-06-17 DIAGNOSIS — Z872 Personal history of diseases of the skin and subcutaneous tissue: Secondary | ICD-10-CM | POA: Diagnosis not present

## 2015-06-17 DIAGNOSIS — Z79899 Other long term (current) drug therapy: Secondary | ICD-10-CM | POA: Insufficient documentation

## 2015-06-17 DIAGNOSIS — Z8719 Personal history of other diseases of the digestive system: Secondary | ICD-10-CM | POA: Insufficient documentation

## 2015-06-17 MED ORDER — PREDNISONE 20 MG PO TABS
60.0000 mg | ORAL_TABLET | Freq: Once | ORAL | Status: AC
  Administered 2015-06-17: 60 mg via ORAL
  Filled 2015-06-17: qty 3

## 2015-06-17 MED ORDER — HYPROMELLOSE 0.3 % OP GEL: Freq: Every evening | OPHTHALMIC | Status: DC | PRN

## 2015-06-17 MED ORDER — VALACYCLOVIR HCL 1 G PO TABS: 1000.0000 mg | ORAL_TABLET | Freq: Three times a day (TID) | ORAL | Status: DC

## 2015-06-17 MED ORDER — PREDNISONE 20 MG PO TABS: ORAL_TABLET | ORAL | Status: DC

## 2015-06-17 NOTE — Discharge Instructions (Signed)
It was our pleasure to provide your ER care today - we hope that you feel better.  Take prednisone and valtrex as prescribed.  It is very important that you keep your left eye moist at all times.  Use lubricating eye drops multiple times to ensure stays moist.  Before bed, apply the lubricating ointment, and tape eye gently closed to avoid dryness and/or injury to eye.   Follow up with primary care doctor in coming week.  Your blood pressure is also high - have it rechecked by your doctor at follow up this week.  Return to ER if worse, new symptoms, fevers, severe eye pain or change in vision, any new numbness/weakness or change in functional ability, other concern.      Bell Palsy Bell palsy is a condition in which the muscles on one side of the face become paralyzed. This often causes one side of the face to droop. It is a common condition and most people recover completely. RISK FACTORS Risk factors for Bell palsy include:  Pregnancy.  Diabetes.  An infection by a virus, such as infections that cause cold sores. CAUSES  Bell palsy is caused by damage to or inflammation of a nerve in your face. It is unclear why this happens, but an infection by a virus may lead to it. Most of the time the reason it happens is unknown. SIGNS AND SYMPTOMS  Symptoms can range from mild to severe and can take place over a number of hours. Symptoms may include:  Being unable to:  Raise one or both eyebrows.  Close one or both eyes.  Feel parts of your face (facial numbness).  Drooping of the eyelid and corner of the mouth.  Weakness in the face.  Paralysis of half your face.  Loss of taste.  Sensitivity to loud noises.  Difficulty chewing.  Tearing up of the affected eye.  Dryness in the affected eye.  Drooling.  Pain behind one ear. DIAGNOSIS  Diagnosis of Bell palsy may include:  A medical history and physical exam.  An MRI.  A CT scan.  Electromyography (EMG). This  is a test that checks how your nerves are working. TREATMENT  Treatment may include antiviral medicine to help shorten the length of the condition. Sometimes treatment is not needed and the symptoms go away on their own. HOME CARE INSTRUCTIONS   Take medicines only as directed by your health care provider.  Do facial massages and exercises as directed by your health care provider.  If your eye is affected:  Use moisturizing eye drops to prevent drying of your eye as directed by your health care provider.  Protect your eye as directed by your health care provider. SEEK MEDICAL CARE IF:  Your symptoms do not get better or get worse.  You are drooling.  Your eye is red, irritated, or hurts. SEEK IMMEDIATE MEDICAL CARE IF:   Another part of your body feels weak or numb.  You have difficulty swallowing.  You have a fever along with symptoms of Bell palsy.  You develop neck pain. MAKE SURE YOU:   Understand these instructions.  Will watch your condition.  Will get help right away if you are not doing well or get worse.   This information is not intended to replace advice given to you by your health care provider. Make sure you discuss any questions you have with your health care provider.   Document Released: 04/29/2005 Document Revised: 01/18/2015 Document Reviewed: 08/06/2013 Elsevier Interactive  Patient Education 2016 [employer].

## 2015-06-17 NOTE — ED Notes (Signed)
Pt complaint of left facial numbness and droop onset with awakening 0730 Friday morning; went to sleep 2330 Thursday.

## 2015-06-17 NOTE — ED Provider Notes (Signed)
CSN: [id number]     Arrival date & time 06/17/15  1101 History   First MD Initiated Contact with Patient 06/17/15 1120     Chief Complaint  Patient presents with  . Facial Droop  . Numbness     (Consider location/radiation/quality/duration/timing/severity/associated sxs/prior Treatment) The history is provided by the patient.  Patient presents w acute onset left facial weakness yesterday.  States seems more pronounced today. Symptoms persistent, constant since onset. No facial numbness. Denies hx same. No extremity numbness/weakness or loss of normal function. No eye pain, diplopia, or change in vision.  No change in speech. No change in balance or normal functional ability. No facial/ear rash or lesions. No hearing loss, new tinnitus, or ear pain. States does have mild discomfort sensation about the left ear.  No fever or chills. No change in meds. No recent viral/febrile illness.        Past Medical History  Diagnosis Date  . Rhinitis   . Hyperlipidemia   . Eczema   . Hernia   . Hypothyroid    Past Surgical History  Procedure Laterality Date  . Shoulder surgery    . Bicep tendon rupture    . Left finger surgery    . Bunionectomy    . Knee arthroscopy    . Toe removal    . Meckel diverticulum excision    . Hernia repair  9/13    bilat ing hernias  . Parotidectomy  11/25/2011    Procedure: PAROTIDECTOMY;  Surgeon: Izora Gala, MD;  Location: [facility];  Service: ENT;  Laterality: Right;   No family history on file. Social History  Substance Use Topics  . Smoking status: Never Smoker   . Smokeless tobacco: None  . Alcohol Use: 1.2 oz/week    2 Glasses of wine per week    Review of Systems  Constitutional: Negative for fever.  HENT: Negative for sore throat.   Eyes: Negative for pain, redness and visual disturbance.  Respiratory: Negative for shortness of breath.   Cardiovascular: Negative for chest pain.  Gastrointestinal: Negative for vomiting and  abdominal pain.  Genitourinary: Negative for flank pain.  Musculoskeletal: Negative for back pain and neck pain.  Skin: Negative for rash.  Neurological: Negative for speech difficulty, numbness and headaches.  Hematological: Does not bruise/bleed easily.  Psychiatric/Behavioral: Negative for confusion.      Allergies  Review of patient's allergies indicates no known allergies.  Home Medications   Prior to Admission medications   Medication Sig Start Date End Date Taking? Authorizing Provider  ferrous sulfate 325 (65 FE) MG EC tablet Take 325 mg by mouth daily with breakfast.   Yes Historical Provider, MD  ibuprofen (ADVIL,MOTRIN) 200 MG tablet Take 600 mg by mouth every 6 (six) hours as needed for moderate pain.   Yes Historical Provider, MD  levothyroxine (SYNTHROID, LEVOTHROID) 125 MCG tablet Take 125 mcg by mouth daily.     Yes Historical Provider, MD  naproxen sodium (ANAPROX) 220 MG tablet Take 440 mg by mouth 2 (two) times daily as needed (pain).   Yes Historical Provider, MD  tadalafil (CIALIS) 5 MG tablet Take 5 mg by mouth daily.   Yes Historical Provider, MD  vitamin C (ASCORBIC ACID) 250 MG tablet Take 250 mg by mouth daily.   Yes Historical Provider, MD  cetirizine (ZYRTEC) 10 MG tablet Take 10 mg by mouth daily as needed for allergies.     Historical Provider, MD   BP 176/82 mmHg  Pulse 66  Temp(Src) 97.6 F (36.4 C) (Oral)  Resp 16  Ht 5\' 10"  (1.778 m)  Wt 90.719 kg  BMI 28.70 kg/m2  SpO2 100% Physical Exam  Constitutional: He is oriented to person, place, and time. He appears well-developed and well-nourished. No distress.  HENT:  Head: Atraumatic.  Mouth/Throat: Oropharynx is clear and moist.  Left eac clear, tm wnl. No mastoid tenderness.  No facial or neck mass felt. No trismus.   Eyes: Conjunctivae and EOM are normal. Pupils are equal, round, and reactive to light.  Conj normal. Eyes appears moist. No ulcer/lesions noted.   Neck: Normal range of motion.  Neck supple. No tracheal deviation present.  No bruit. No mass.   Cardiovascular: Normal rate, regular rhythm, normal heart sounds and intact distal pulses.   Pulmonary/Chest: Effort normal and breath sounds normal. No accessory muscle usage. No respiratory distress.  Abdominal: He exhibits no distension.  Musculoskeletal: He exhibits no edema.  Neurological: He is alert and oriented to person, place, and time.  Left facial weakness including forehead. Pt unable to close left upper lid. No pronator drift. Finger to nose wnl bil.  Motor intact bil, stre 5/5. sens grossly intact. Steady gait.   Skin: Skin is warm and dry. No rash noted. He is not diaphoretic.  Psychiatric: He has a normal mood and affect.  Nursing note and vitals reviewed.   ED Course  Procedures (including critical care time)    I have personally reviewed and evaluated these images as part of my medical decision-making.   EKG Interpretation   Date/Time:  Saturday June 17 2015 11:09:56 EST Ventricular Rate:  68 PR Interval:  206 QRS Duration: 88 QT Interval:  396 QTC Calculation: 421 R Axis:   4 Text Interpretation:  Sinus rhythm No significant change since last  tracing Confirmed by Ashok Cordia  MD, Lennette Bihari ([id number]) on 06/17/2015 11:47:47 AM      MDM   Imaging.  Reviewed nursing notes and prior charts for additional history.   Exam c/w Bells Palsy.  Will rx valtrex, pred, eye drops, eye care, close pcp f/u.   Pt currently appears stable for d/c.   Return precautions provided.      Lajean Saver, MD 06/17/15 1245

## 2015-11-05 ENCOUNTER — Emergency Department (HOSPITAL_COMMUNITY)
Admission: EM | Admit: 2015-11-05 | Discharge: 2015-11-05 | Disposition: A | Discharge status: Home / Self Care | Payer: Medicare Other | Attending: Emergency Medicine | Admitting: Emergency Medicine

## 2015-11-05 ENCOUNTER — Emergency Department (HOSPITAL_COMMUNITY): Payer: Medicare Other

## 2015-11-05 ENCOUNTER — Encounter (HOSPITAL_COMMUNITY): Payer: Self-pay | Admitting: *Deleted

## 2015-11-05 DIAGNOSIS — Z79899 Other long term (current) drug therapy: Secondary | ICD-10-CM | POA: Diagnosis not present

## 2015-11-05 DIAGNOSIS — E039 Hypothyroidism, unspecified: Secondary | ICD-10-CM | POA: Diagnosis not present

## 2015-11-05 DIAGNOSIS — S0501XA Injury of conjunctiva and corneal abrasion without foreign body, right eye, initial encounter: Secondary | ICD-10-CM | POA: Diagnosis not present

## 2015-11-05 DIAGNOSIS — E785 Hyperlipidemia, unspecified: Secondary | ICD-10-CM | POA: Diagnosis not present

## 2015-11-05 DIAGNOSIS — W228XXA Striking against or struck by other objects, initial encounter: Secondary | ICD-10-CM | POA: Diagnosis not present

## 2015-11-05 DIAGNOSIS — Y9389 Activity, other specified: Secondary | ICD-10-CM | POA: Diagnosis not present

## 2015-11-05 DIAGNOSIS — Y998 Other external cause status: Secondary | ICD-10-CM | POA: Insufficient documentation

## 2015-11-05 DIAGNOSIS — S058X1A Other injuries of right eye and orbit, initial encounter: Secondary | ICD-10-CM

## 2015-11-05 DIAGNOSIS — Y92007 Garden or yard of unspecified non-institutional (private) residence as the place of occurrence of the external cause: Secondary | ICD-10-CM | POA: Insufficient documentation

## 2015-11-05 DIAGNOSIS — S0591XA Unspecified injury of right eye and orbit, initial encounter: Secondary | ICD-10-CM | POA: Diagnosis present

## 2015-11-05 MED ORDER — ERYTHROMYCIN 5 MG/GM OP OINT: TOPICAL_OINTMENT | OPHTHALMIC | Status: DC

## 2015-11-05 MED ORDER — TETRACAINE HCL 0.5 % OP SOLN
2.0000 [drp] | Freq: Once | OPHTHALMIC | Status: AC
  Administered 2015-11-05: 2 [drp] via OPHTHALMIC
  Filled 2015-11-05: qty 4

## 2015-11-05 MED ORDER — FLUORESCEIN SODIUM 1 MG OP STRP
1.0000 | ORAL_STRIP | Freq: Once | OPHTHALMIC | Status: AC
  Administered 2015-11-05: 1 via OPHTHALMIC
  Filled 2015-11-05: qty 1

## 2015-11-05 NOTE — ED Provider Notes (Signed)
CSN: [id number]     Arrival date & time 11/05/15  1201 History   First MD Initiated Contact with Patient 11/05/15 1225     Chief Complaint  Patient presents with  . Eye Injury     (Consider location/radiation/quality/duration/timing/severity/associated sxs/prior Treatment) HPI   Devon Jacobs is a 75 year old male who presents the ED today complaining of a right eye injury. Patient states that he was cleaning up his yard when a branch whipped around striking him in his right eye around his glasses. Glasses did not break. The injury occurred approximately 1 hour PTA. Patient has obvious redness in his right eye but he denies any pain or vision change. Tetanus UTD. Pt is not a contact lens wearer.   Past Medical History  Diagnosis Date  . Rhinitis   . Hyperlipidemia   . Eczema   . Hernia   . Hypothyroid    Past Surgical History  Procedure Laterality Date  . Shoulder surgery    . Bicep tendon rupture    . Left finger surgery    . Bunionectomy    . Knee arthroscopy    . Toe removal    . Meckel diverticulum excision    . Hernia repair  9/13    bilat ing hernias  . Parotidectomy  11/25/2011    Procedure: PAROTIDECTOMY;  Surgeon: Izora Gala, MD;  Location: [facility];  Service: ENT;  Laterality: Right;   No family history on file. Social History  Substance Use Topics  . Smoking status: Never Smoker   . Smokeless tobacco: None  . Alcohol Use: 1.2 oz/week    2 Glasses of wine per week    Review of Systems  All other systems reviewed and are negative.     Allergies  Review of patient's allergies indicates no known allergies.  Home Medications   Prior to Admission medications   Medication Sig Start Date End Date Taking? Authorizing Provider  cetirizine (ZYRTEC) 10 MG tablet Take 10 mg by mouth daily as needed for allergies.     Historical Provider, MD  ferrous sulfate 325 (65 FE) MG EC tablet Take 325 mg by mouth daily with breakfast.    Historical  Provider, MD  hypromellose (GENTEAL SEVERE) 0.3 % GEL ophthalmic ointment Place into the left eye at bedtime as needed for dry eyes. Apply ribbon of ointment inside the lower lid prior to going to bed, and as needed. 06/17/15   Lajean Saver, MD  ibuprofen (ADVIL,MOTRIN) 200 MG tablet Take 600 mg by mouth every 6 (six) hours as needed for moderate pain.    Historical Provider, MD  levothyroxine (SYNTHROID, LEVOTHROID) 125 MCG tablet Take 125 mcg by mouth daily.      Historical Provider, MD  naproxen sodium (ANAPROX) 220 MG tablet Take 440 mg by mouth 2 (two) times daily as needed (pain).    Historical Provider, MD  predniSONE (DELTASONE) 20 MG tablet 3 po once a day for 2 days, then 2 po once a day for 3 days, then 1 po once a day for 3 days 06/17/15   Lajean Saver, MD  tadalafil (CIALIS) 5 MG tablet Take 5 mg by mouth daily.    Historical Provider, MD  valACYclovir (VALTREX) 1000 MG tablet Take 1 tablet (1,000 mg total) by mouth 3 (three) times daily. 06/17/15   Lajean Saver, MD  vitamin C (ASCORBIC ACID) 250 MG tablet Take 250 mg by mouth daily.    Historical Provider, MD   BP 170/89  mmHg  Pulse 61  Temp(Src) 98.2 F (36.8 C) (Oral)  Resp 16  SpO2 96% Physical Exam  Constitutional: He is oriented to person, place, and time. He appears well-developed and well-nourished. No distress.  HENT:  Head: Normocephalic and atraumatic.  Eyes: EOM and lids are normal. Pupils are equal, round, and reactive to light. Lids are everted and swept, no foreign bodies found. Right eye exhibits no discharge. Left eye exhibits no discharge. Right conjunctiva has a hemorrhage. No scleral icterus.  Slit lamp exam:      The right eye shows fluorescein uptake. The right eye shows no corneal abrasion, no corneal flare, no corneal ulcer, no foreign body and no anterior chamber bulge.       The left eye shows no anterior chamber bulge.  Fluoroscein uptake seen in R lateral sclera. No uptake over cornea. No foreign body seen  with slit lamp. No evidence of ruptured globe.  Cardiovascular: Normal rate.   Pulmonary/Chest: Effort normal.  Neurological: He is alert and oriented to person, place, and time. Coordination normal.  Skin: Skin is warm and dry. No rash noted. He is not diaphoretic. No erythema. No pallor.  Psychiatric: He has a normal mood and affect. His behavior is normal.  Nursing note and vitals reviewed.   ED Course  Procedures (including critical care time) Labs Review Labs Reviewed - No data to display  Imaging Review Dg Eye Foreign Body  11/05/2015  CLINICAL DATA:  Right orbital injury from stick while doing yard work today. EXAM: ORBITS FOR FOREIGN BODY - 2 VIEW COMPARISON:  06/17/2015 head CT. FINDINGS: No evidence of metallic foreign body within or adjacent to the orbits. No displaced orbital fracture. No fluid levels in the paranasal sinuses. No suspicious focal osseous lesions. IMPRESSION: No evidence of radiopaque foreign body within or adjacent to the orbits. No displaced orbital fracture. No fluid levels in the paranasal sinuses. Electronically Signed   By: Ilona Sorrel M.D.   On: 11/05/2015 14:40   I have personally reviewed and evaluated these images and lab results as part of my medical decision-making.   EKG Interpretation None      MDM   Final diagnoses:  Abrasion of sclera of right eye, initial encounter    75 y.o M presents to the ED today after being struck in the R eye by a branch. Visual acuity intact, no vision changes. No pain in eye. Obvious scleral hemorrhage in R eye. Fluoroscein uptake seen over sclera. No uptake over cornea. Difficult to assess for foreign body. Slit lamp exam performed by Dr. Gilford Raid and Dr. Laneta Simmers who did not visualize a foreign body. No FB seen on xray of orbit. Appears to be an abrasion to sclera. Will treat with erythromycin ointment and follow up with ophthalmology. Pt UTD on tetanus. Discussed treatment plan with pt who is agreeable. Return  precautions outlined in patient discharge instructions.   Patient was discussed with and seen by Dr. Gilford Raid who agrees with the treatment plan.      Dondra Spry [place], PA-C 11/05/15 1524  Isla Pence, MD 11/09/15 1904

## 2015-11-05 NOTE — Discharge Instructions (Signed)
Apply ointment to eye twice daily for 10 days. Follow up with ophthalmology for re-evaluation. Return to the ED if you experience increased redness or swelling around your eye, pain in your eye, change in vision, fevers, chills.

## 2015-11-05 NOTE — ED Notes (Signed)
Pt breaking sticks in yard to dispose of, one flew back and swiped rt eye, small puncture wound in rt sclera with red blood pooled around area.

## 2016-03-13 IMAGING — CT CT HEAD W/O CM
2 series · 16 of 30 positions shown, 20 images · non-contrast
Comparison: CT scan of October 27, 2003.

CLINICAL DATA: Left facial numbness and droop.

EXAM:
CT HEAD WITHOUT CONTRAST
TECHNIQUE: Contiguous axial images were obtained from the base of the skull
through the vertex without intravenous contrast.

[Series 2: head w/o · axial · non-contrast · 0.45mm/px · z∈[-155,-20]mm · 13 of 33 slices shown, 17 images]
[im 3/33  brain]
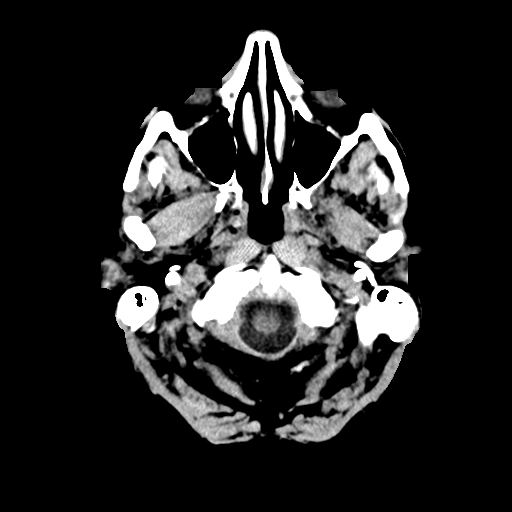
[im 3/33  bone]
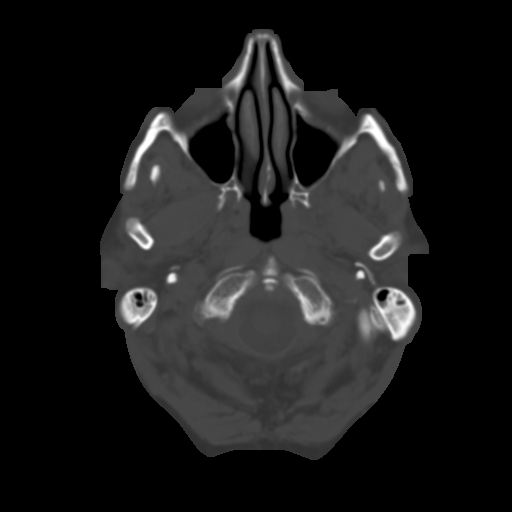
[im 5/33  brain]
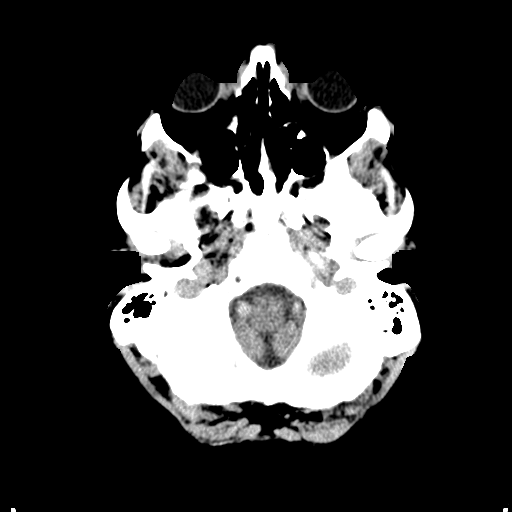
[im 7/33  brain]
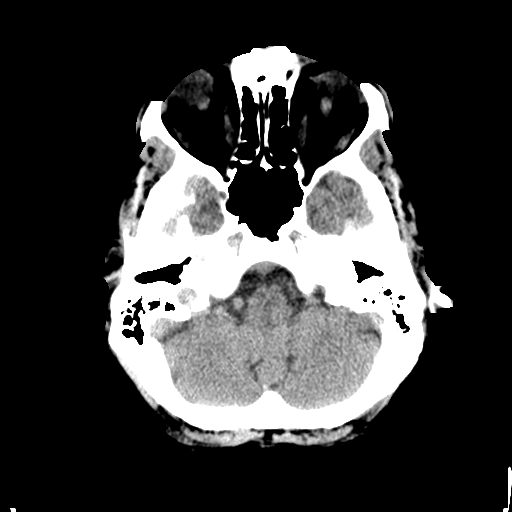
[im 10/33  brain]
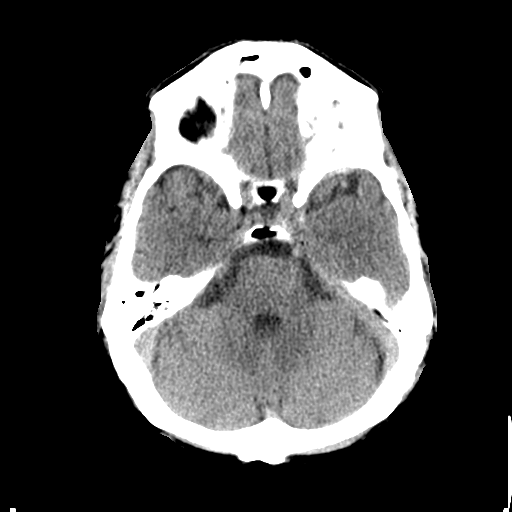
[im 12/33  brain]
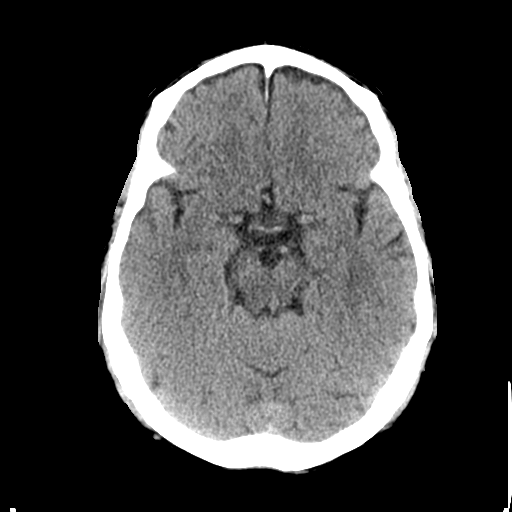
[im 12/33  bone]
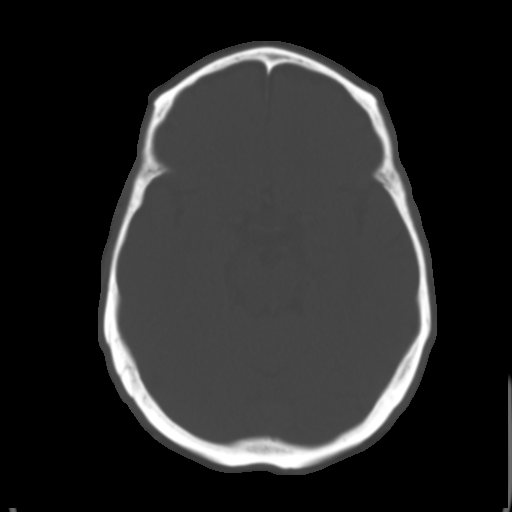
[im 14/33  brain]
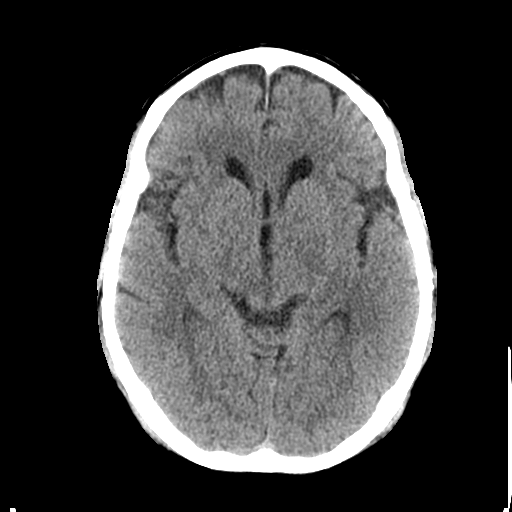
[im 17/33  brain]
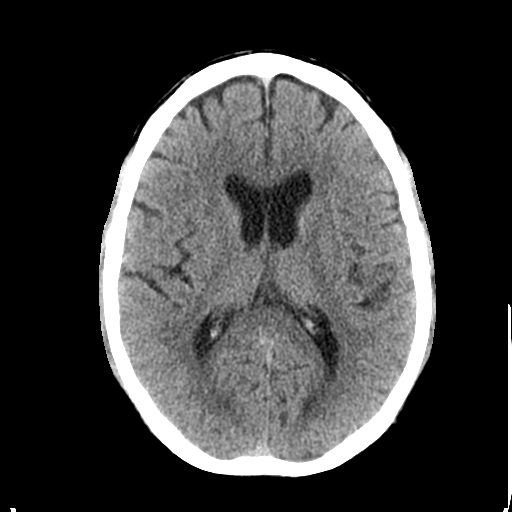
[im 19/33  brain]
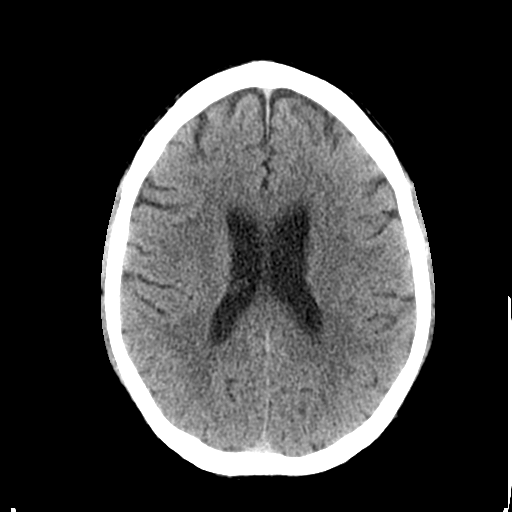
[im 21/33  brain]
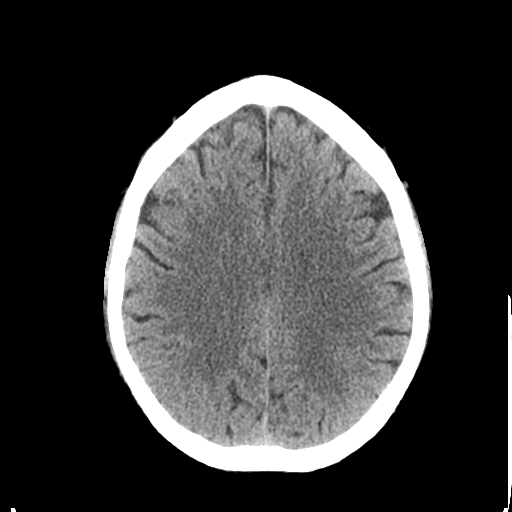
[im 21/33  bone]
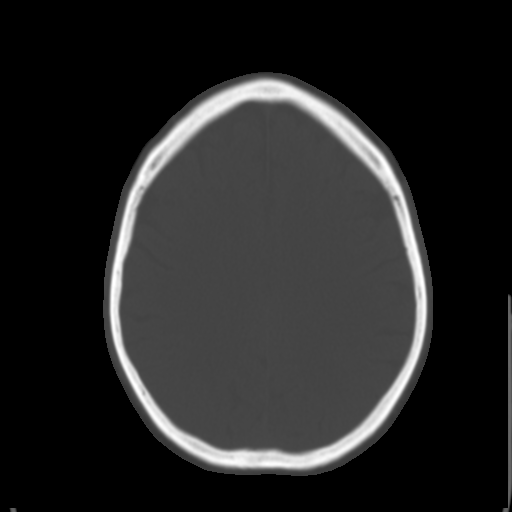
[im 23/33  brain]
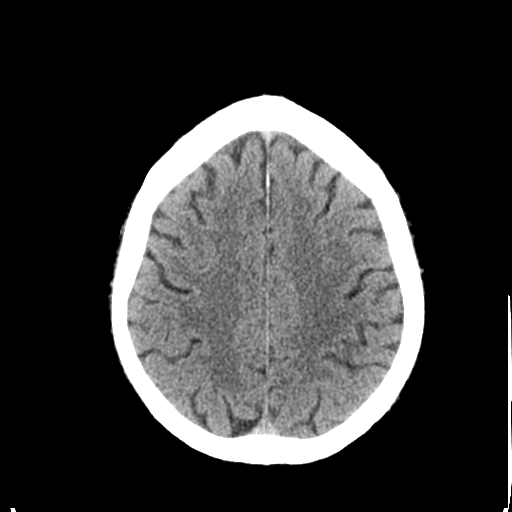
[im 26/33  brain]
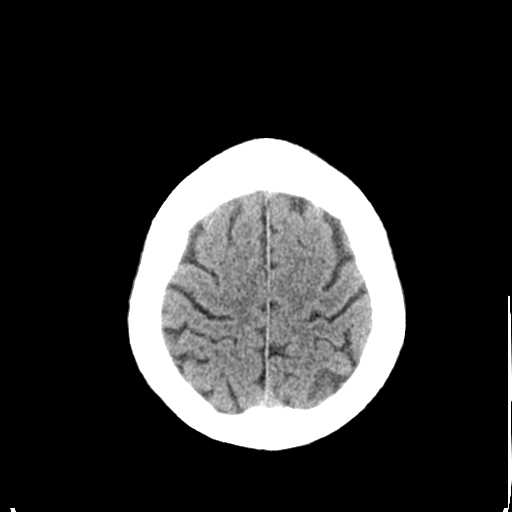
[im 28/33  brain]
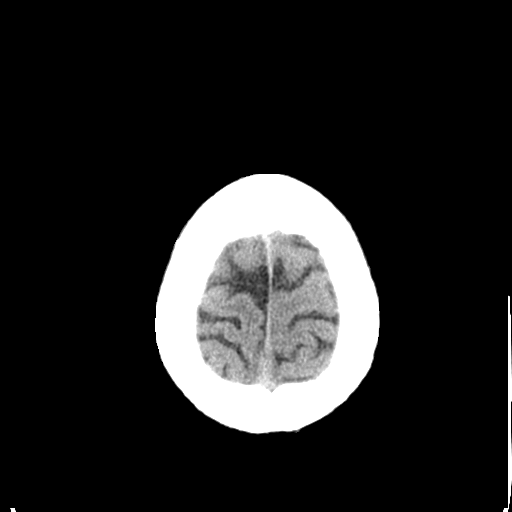
[im 30/33  brain]
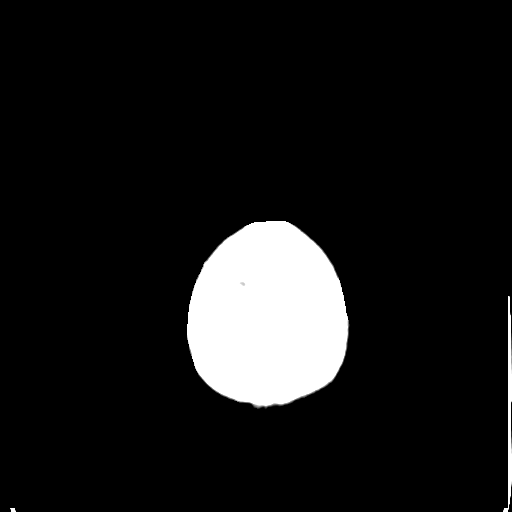
[im 30/33  bone]
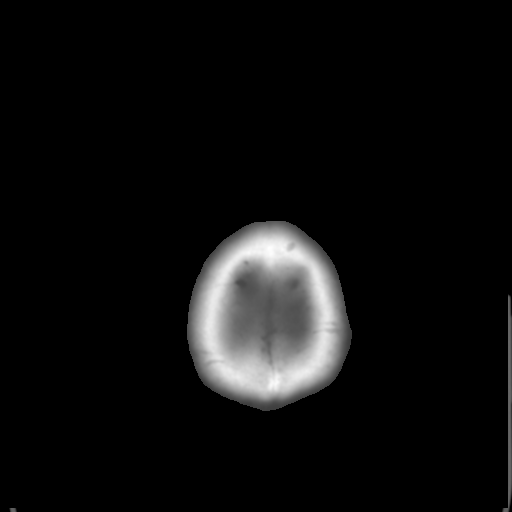

[Series 3: bone windows · axial · 0.45mm/px · z∈[-155,-110]mm · 3 of 33 slices shown]
[im 3/33  bone]
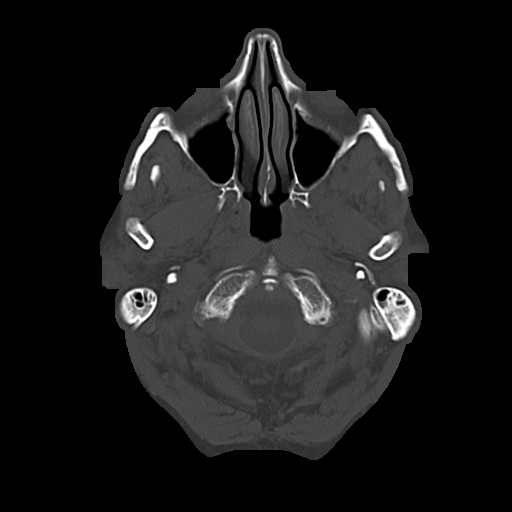
[im 7/33  bone]
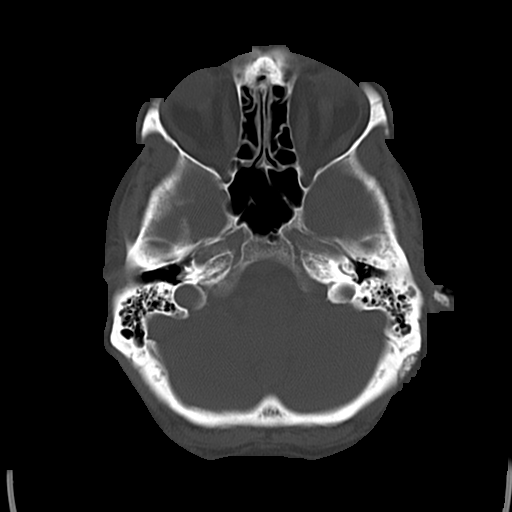
[im 12/33  bone]
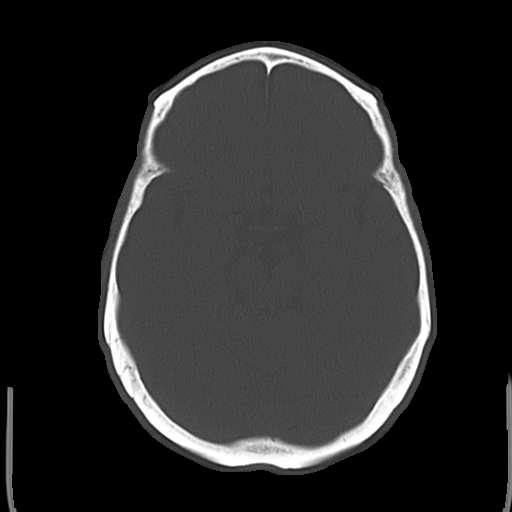

[16 of 30 positions shown; findings below may reference images not displayed]

FINDINGS: Bony calvarium appears intact. No mass effect or midline shift is
noted. Ventricular size is within normal limits. There is no
evidence of mass lesion, hemorrhage or acute infarction.
IMPRESSION: Normal head CT.

## 2017-07-01 ENCOUNTER — Other Ambulatory Visit: Payer: Self-pay | Admitting: Ophthalmology

## 2017-07-01 DIAGNOSIS — G453 Amaurosis fugax: Secondary | ICD-10-CM

## 2017-07-02 ENCOUNTER — Other Ambulatory Visit (HOSPITAL_COMMUNITY): Payer: Self-pay | Admitting: Ophthalmology

## 2017-07-02 DIAGNOSIS — G453 Amaurosis fugax: Secondary | ICD-10-CM

## 2017-07-04 ENCOUNTER — Ambulatory Visit (HOSPITAL_COMMUNITY)
Admission: RE | Admit: 2017-07-04 | Discharge: 2017-07-04 | Disposition: A | Discharge status: Home / Self Care | Payer: Medicare Other | Source: Ambulatory Visit | Attending: Cardiology | Admitting: Cardiology

## 2017-07-04 ENCOUNTER — Other Ambulatory Visit: Payer: Self-pay

## 2017-07-04 ENCOUNTER — Ambulatory Visit (HOSPITAL_BASED_OUTPATIENT_CLINIC_OR_DEPARTMENT_OTHER): Payer: Medicare Other

## 2017-07-04 DIAGNOSIS — R9389 Abnormal findings on diagnostic imaging of other specified body structures: Secondary | ICD-10-CM | POA: Diagnosis not present

## 2017-07-04 DIAGNOSIS — G453 Amaurosis fugax: Secondary | ICD-10-CM | POA: Insufficient documentation

## 2017-07-04 DIAGNOSIS — I351 Nonrheumatic aortic (valve) insufficiency: Secondary | ICD-10-CM | POA: Insufficient documentation

## 2017-07-04 DIAGNOSIS — E039 Hypothyroidism, unspecified: Secondary | ICD-10-CM | POA: Diagnosis not present

## 2017-07-04 DIAGNOSIS — E785 Hyperlipidemia, unspecified: Secondary | ICD-10-CM | POA: Insufficient documentation

## 2017-10-09 ENCOUNTER — Other Ambulatory Visit: Payer: Self-pay | Admitting: Family Medicine

## 2017-10-09 DIAGNOSIS — R2241 Localized swelling, mass and lump, right lower limb: Secondary | ICD-10-CM

## 2017-10-13 ENCOUNTER — Ambulatory Visit
Admission: RE | Admit: 2017-10-13 | Discharge: 2017-10-13 | Disposition: A | Discharge status: Home / Self Care | Payer: Medicare Other | Source: Ambulatory Visit | Attending: Family Medicine | Admitting: Family Medicine

## 2017-10-13 DIAGNOSIS — R2241 Localized swelling, mass and lump, right lower limb: Secondary | ICD-10-CM

## 2017-12-21 ENCOUNTER — Emergency Department (HOSPITAL_COMMUNITY)
Admission: EM | Admit: 2017-12-21 | Discharge: 2017-12-21 | Disposition: A | Discharge status: Home / Self Care | Payer: Medicare Other | Attending: Emergency Medicine | Admitting: Emergency Medicine

## 2017-12-21 ENCOUNTER — Encounter (HOSPITAL_COMMUNITY): Payer: Self-pay | Admitting: Emergency Medicine

## 2017-12-21 ENCOUNTER — Other Ambulatory Visit: Payer: Self-pay

## 2017-12-21 DIAGNOSIS — Z79899 Other long term (current) drug therapy: Secondary | ICD-10-CM | POA: Insufficient documentation

## 2017-12-21 DIAGNOSIS — E039 Hypothyroidism, unspecified: Secondary | ICD-10-CM | POA: Diagnosis not present

## 2017-12-21 DIAGNOSIS — R300 Dysuria: Secondary | ICD-10-CM | POA: Diagnosis present

## 2017-12-21 DIAGNOSIS — R3915 Urgency of urination: Secondary | ICD-10-CM | POA: Diagnosis not present

## 2017-12-21 LAB — URINALYSIS, ROUTINE W REFLEX MICROSCOPIC
Bacteria, UA: NONE SEEN
Bilirubin Urine: NEGATIVE
HGB URINE DIPSTICK: NEGATIVE
Ketones, ur: NEGATIVE mg/dL
LEUKOCYTES UA: NEGATIVE
Nitrite: NEGATIVE
PROTEIN: NEGATIVE mg/dL
Specific Gravity, Urine: 1.021 (ref 1.005–1.030)
pH: 5 (ref 5.0–8.0)

## 2017-12-21 NOTE — ED Notes (Signed)
Pt unable to provide urine at this time. Pt provided with water

## 2017-12-21 NOTE — ED Notes (Signed)
Pt still attempting to provide urine

## 2017-12-21 NOTE — ED Triage Notes (Signed)
Pt arrives today s/o some urinary retention until about 4-5 am. Felt urgency, walked around some, and then was able to urinate around 5am. Pt also states intermittent right lower back/flank pain, believes it's musculoskeletal due to golfing. Denies pain or urinary retention at this time. Pt reports that he called his doctor and they encouraged pt to be seen in ED

## 2017-12-21 NOTE — Discharge Instructions (Addendum)
Return here if you are unable to urinate

## 2017-12-21 NOTE — ED Provider Notes (Signed)
[facility] DEPT Provider Note   CSN: [id number] Arrival date & time: 12/21/17  1152     History   Chief Complaint Chief Complaint  Patient presents with  . Flank Pain    HPI Devon Jacobs is a 77 y.o. male.  77 year old male presents with possible urinary retention since about 4 5 this morning.  Has had some urinary urgency but his symptoms have resolved.  Denies any fever or flank pain.  Did have slight dysuria and does have a history of UTI.  He also has history of BPH.  Called his urologist Dr. Jeffie Pollock who told him to come here for evaluation.  Feels at his baseline.     Past Medical History:  Diagnosis Date  . Eczema   . Hernia   . Hyperlipidemia   . Hypothyroid   . Rhinitis     There are no active problems to display for this patient.   Past Surgical History:  Procedure Laterality Date  . bicep tendon rupture    . BUNIONECTOMY    . HERNIA REPAIR  9/13   bilat ing hernias  . KNEE ARTHROSCOPY    . left finger surgery    . MECKEL DIVERTICULUM EXCISION    . PAROTIDECTOMY  11/25/2011   Procedure: PAROTIDECTOMY;  Surgeon: Izora Gala, MD;  Location: [facility];  Service: ENT;  Laterality: Right;  . SHOULDER SURGERY    . toe removal          Home Medications    Prior to Admission medications   Medication Sig Start Date End Date Taking? Authorizing Provider  cetirizine (ZYRTEC) 10 MG tablet Take 10 mg by mouth daily.     [provider]  erythromycin ophthalmic ointment Place a 1/2 inch ribbon of ointment into the lower eyelid. Twice daily for 10 days 11/05/15   Dowless, Aldona Bar Tripp, PA-C  ferrous sulfate 325 (65 FE) MG EC tablet Take 325 mg by mouth daily with breakfast.    [provider]  hypromellose (GENTEAL SEVERE) 0.3 % GEL ophthalmic ointment Place into the left eye at bedtime as needed for dry eyes. Apply ribbon of ointment inside the lower lid prior to going to bed, and as needed.  06/17/15   Lajean Saver, MD  ibuprofen (ADVIL,MOTRIN) 200 MG tablet Take 600 mg by mouth every 6 (six) hours as needed for moderate pain.    [provider]  levothyroxine (SYNTHROID, LEVOTHROID) 100 MCG tablet Take 100 mcg by mouth daily. 10/19/15   [provider]  predniSONE (DELTASONE) 20 MG tablet 3 po once a day for 2 days, then 2 po once a day for 3 days, then 1 po once a day for 3 days Patient not taking: Reported on 11/05/2015 06/17/15   Lajean Saver, MD  tadalafil (CIALIS) 5 MG tablet Take 5 mg by mouth daily.    [provider]  valACYclovir (VALTREX) 1000 MG tablet Take 1 tablet (1,000 mg total) by mouth 3 (three) times daily. Patient not taking: Reported on 11/05/2015 06/17/15   Lajean Saver, MD  vitamin C (ASCORBIC ACID) 250 MG tablet Take 250 mg by mouth daily.    [provider]    Family History History reviewed. No pertinent family history.  Social History Social History   Tobacco Use  . Smoking status: Never Smoker  Substance Use Topics  . Alcohol use: Yes    Alcohol/week: 2.0 standard drinks    Types: 2 Glasses of wine per  week  . Drug use: No     Allergies   Patient has no known allergies.   Review of Systems Review of Systems  All other systems reviewed and are negative.    Physical Exam Updated Vital Signs BP (!) 163/93 (BP Location: Left Arm)   Pulse 96   Resp 12   Ht 1.778 m (5\' 10" )   Wt 90.7 kg   SpO2 97%   BMI 28.70 kg/m   Physical Exam  Constitutional: He is oriented to person, place, and time. He appears well-developed and well-nourished.  Non-toxic appearance. No distress.  HENT:  Head: Normocephalic and atraumatic.  Eyes: Pupils are equal, round, and reactive to light. Conjunctivae, EOM and lids are normal.  Neck: Normal range of motion. Neck supple. No tracheal deviation present. No thyroid mass present.  Cardiovascular: Normal rate, regular rhythm and normal heart sounds. Exam reveals no gallop.  No  murmur heard. Pulmonary/Chest: Effort normal and breath sounds normal. No stridor. No respiratory distress. He has no decreased breath sounds. He has no wheezes. He has no rhonchi. He has no rales.  Abdominal: Soft. Normal appearance and bowel sounds are normal. He exhibits no distension. There is no tenderness. There is no rebound and no CVA tenderness.  Musculoskeletal: Normal range of motion. He exhibits no edema or tenderness.  Neurological: He is alert and oriented to person, place, and time. He has normal strength. No cranial nerve deficit or sensory deficit. GCS eye subscore is 4. GCS verbal subscore is 5. GCS motor subscore is 6.  Skin: Skin is warm and dry. No abrasion and no rash noted.  Psychiatric: He has a normal mood and affect. His speech is normal and behavior is normal.  Nursing note and vitals reviewed.    ED Treatments / Results  Labs (all labs ordered are listed, but only abnormal results are displayed) Labs Reviewed  URINE CULTURE  URINALYSIS, ROUTINE W REFLEX MICROSCOPIC    EKG None  Radiology No results found.  Procedures Procedures (including critical care time)  Medications Ordered in ED Medications - No data to display   Initial Impression / Assessment and Plan / ED Course  I have reviewed the triage vital signs and the nursing notes.  Pertinent labs & imaging results that were available during my care of the patient were reviewed by me and considered in my medical decision making (see chart for details).     Analysis is negative..  Bladder scan showed 130 cc of urine.  Patient able to urinate here and stable for discharge  Final Clinical Impressions(s) / ED Diagnoses   Final diagnoses:  None    ED Discharge Orders    None       Lacretia Leigh, MD 12/21/17 1449

## 2017-12-22 LAB — URINE CULTURE: Culture: NO GROWTH

## 2019-02-08 IMAGING — US US EXTREM LOW*R* LIMITED
1 series · 13 of 13 positions shown · non-contrast
Comparison: None.

CLINICAL DATA: 76-year-old male with mass in the RIGHT popliteal
region for 2 weeks.

EXAM:
ULTRASOUND RIGHT LOWER EXTREMITY LIMITED
TECHNIQUE: Ultrasound examination of the lower extremity soft tissues was
performed in the area of clinical concern.

[Series 1: us extrem low*right* limited · 0.10mm/px · 13 acquisitions, 13 frames shown]
[im 1/13]
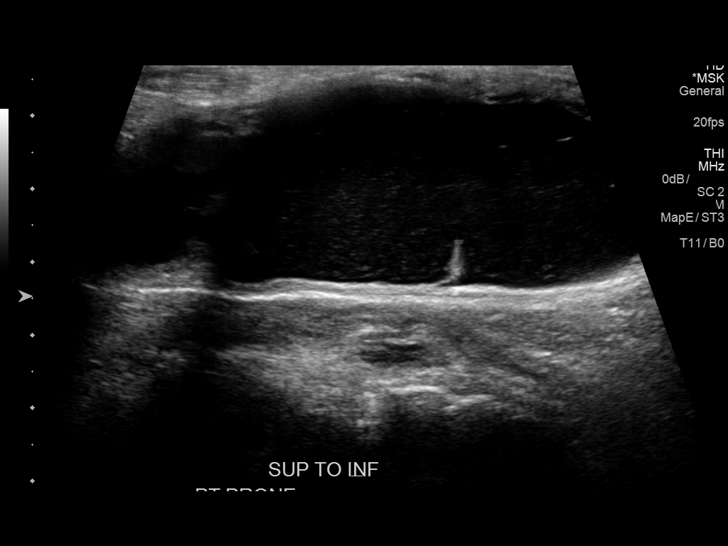
[im 2/13]
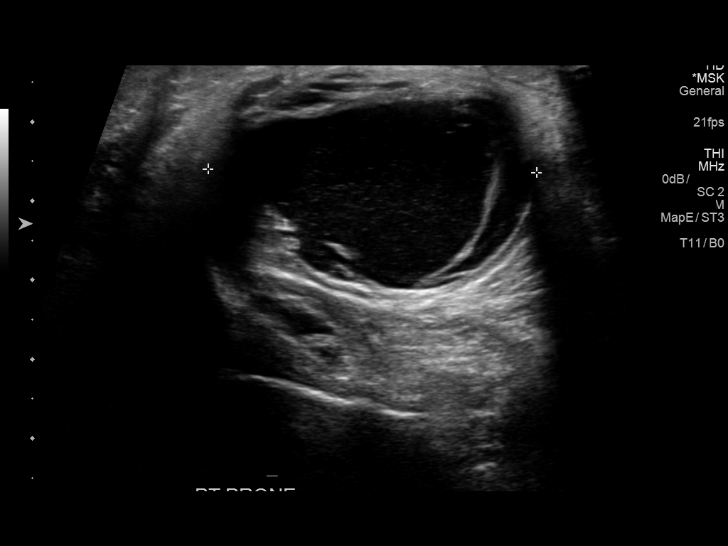
[im 3/13]
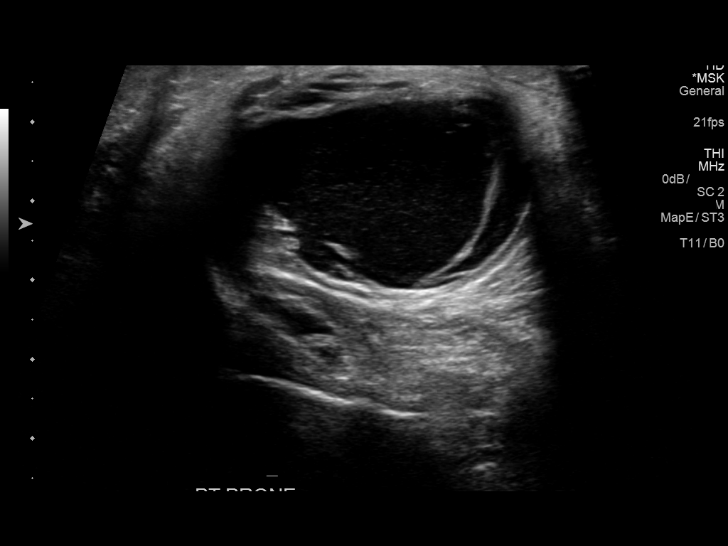
[im 4/13]
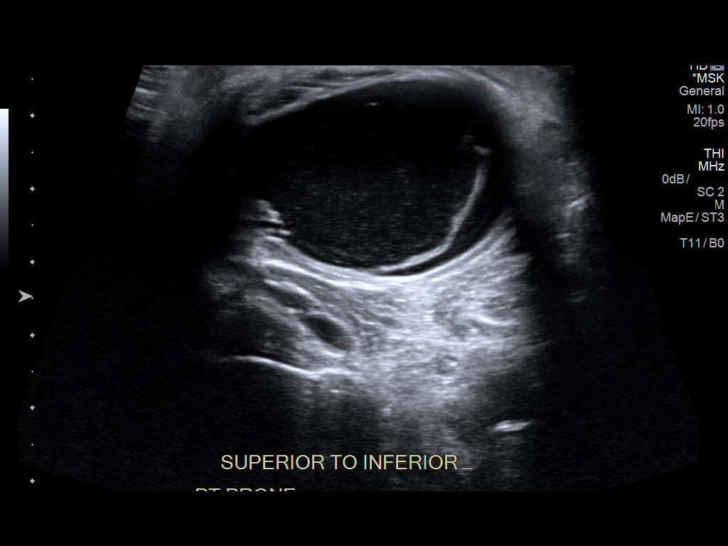
[im 5/13]
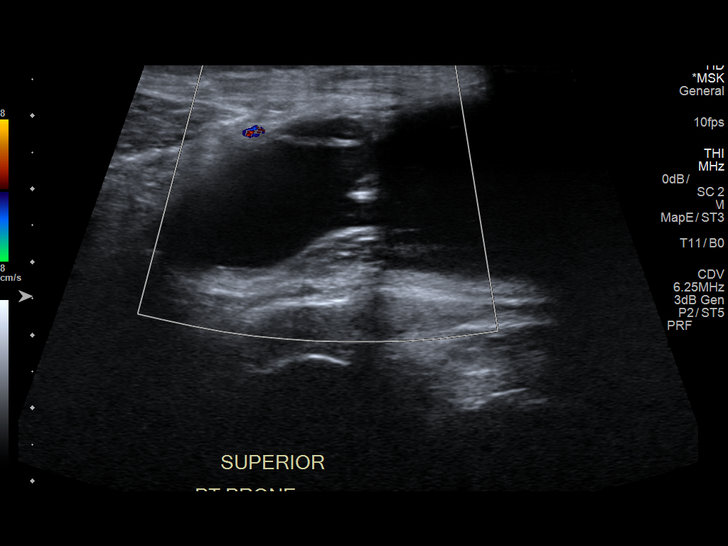
[im 6/13]
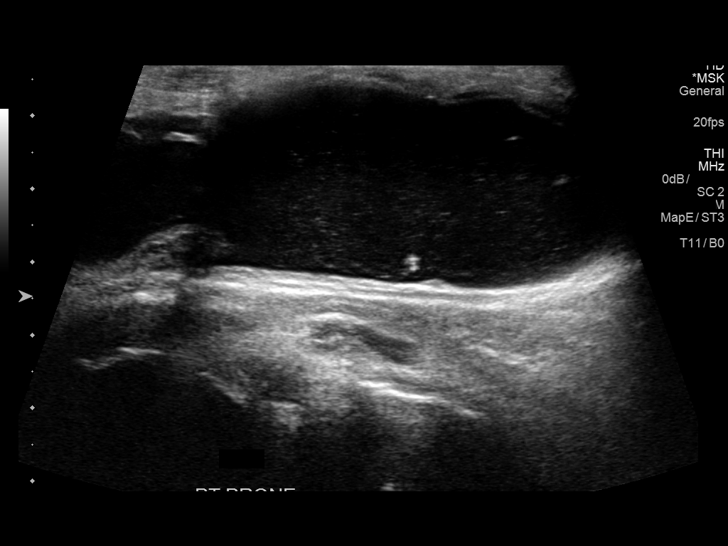
[im 7/13]
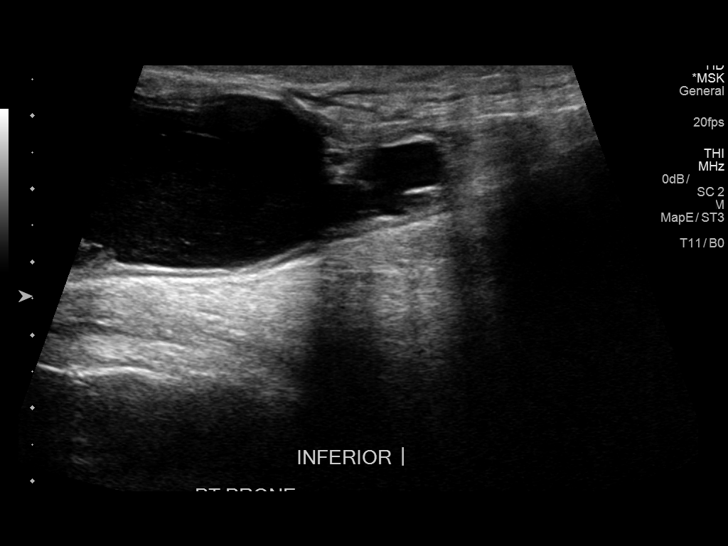
[im 8/13]
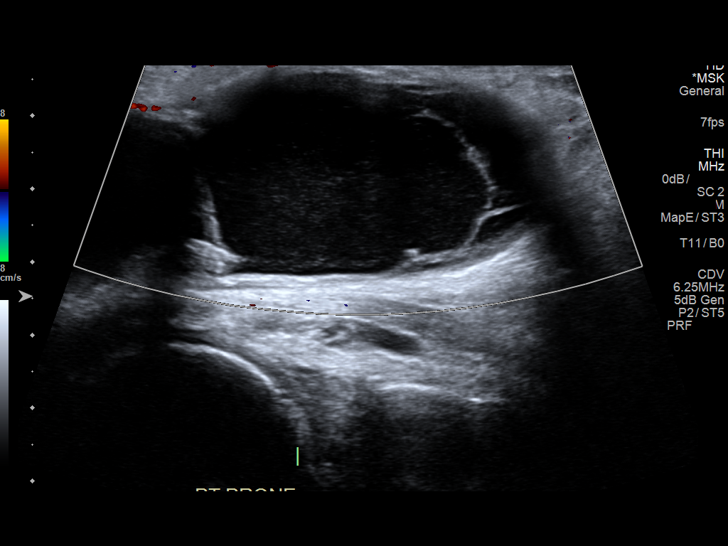
[im 9/13]
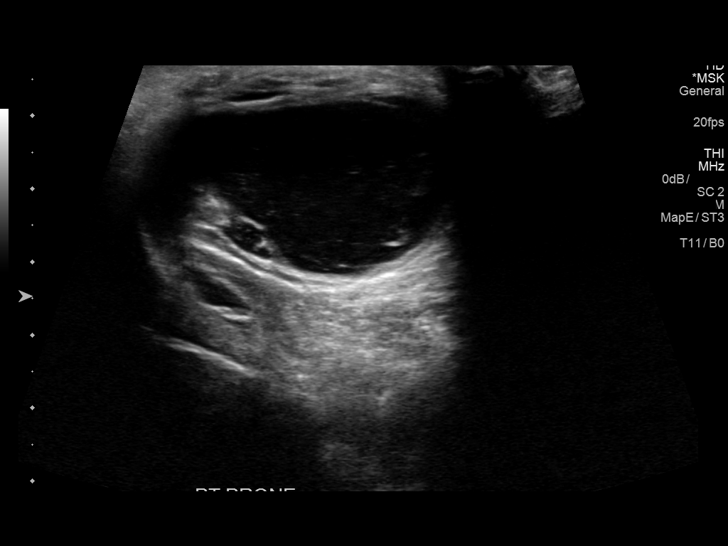
[im 10/13]
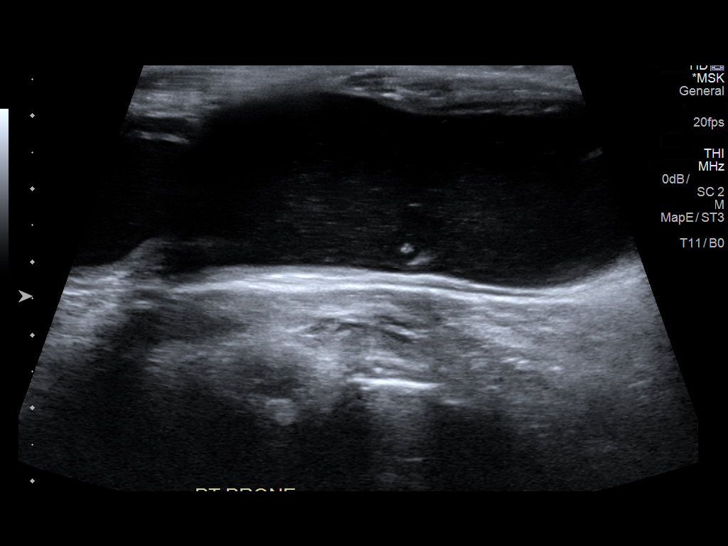
[im 11/13]
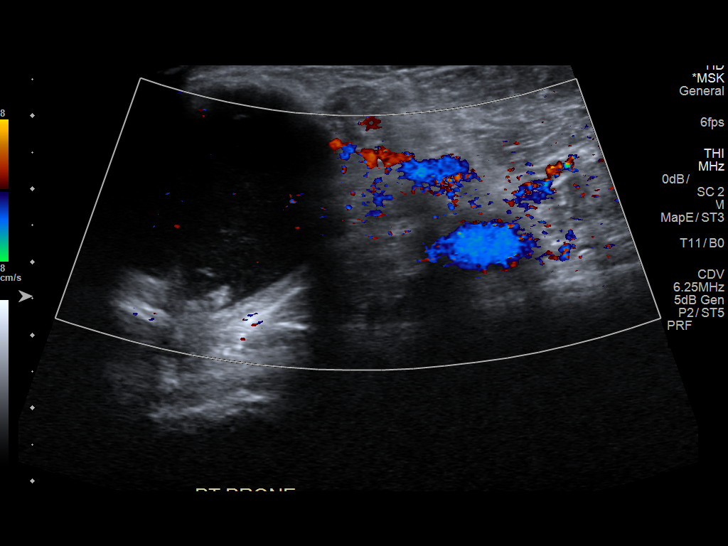
[im 12/13]
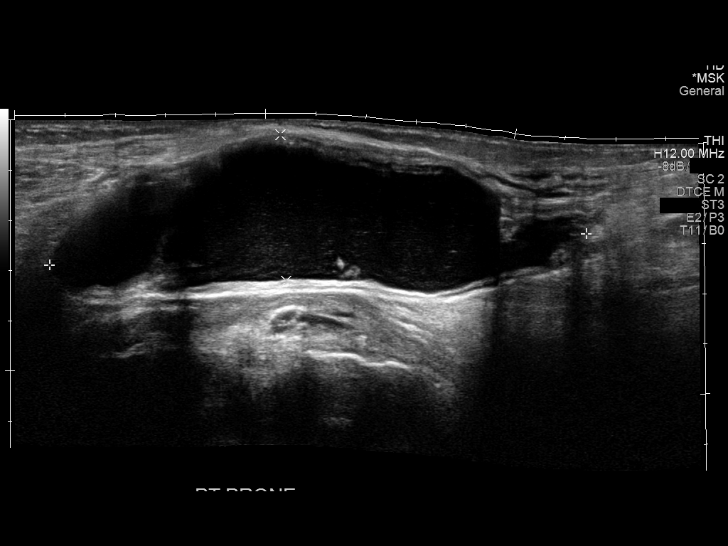
[im 13/13]
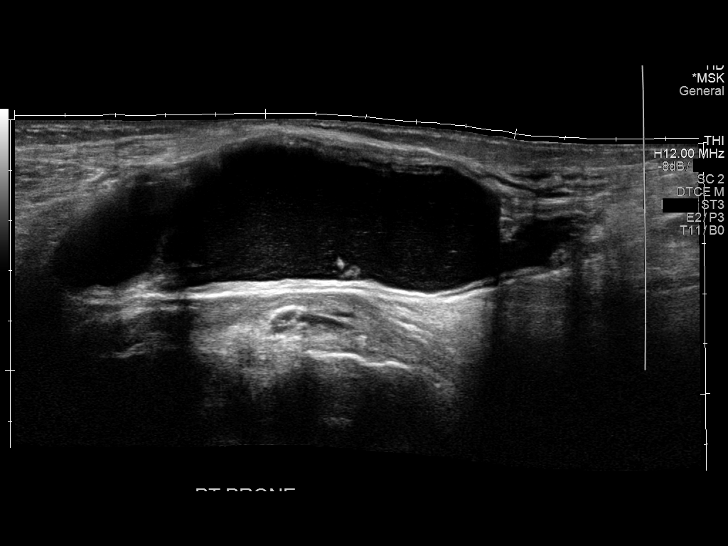

[13 of 13 positions shown; findings below may reference images not displayed]

FINDINGS: A 2.9 x 4.2 x 10.7 cm mildly complex fluid collection in the RIGHT
popliteal fossa is compatible with a Baker cyst.

No solid mass identified.  No other abnormalities noted.
IMPRESSION: 2.9 x 4.2 x 10.7 cm RIGHT popliteal/Baker's cyst.

## 2020-06-29 DIAGNOSIS — L249 Irritant contact dermatitis, unspecified cause: Secondary | ICD-10-CM | POA: Diagnosis not present

## 2020-07-17 DIAGNOSIS — N183 Chronic kidney disease, stage 3 unspecified: Secondary | ICD-10-CM | POA: Diagnosis not present

## 2020-07-17 DIAGNOSIS — R7301 Impaired fasting glucose: Secondary | ICD-10-CM | POA: Diagnosis not present

## 2020-07-17 DIAGNOSIS — E039 Hypothyroidism, unspecified: Secondary | ICD-10-CM | POA: Diagnosis not present

## 2020-10-11 DIAGNOSIS — J329 Chronic sinusitis, unspecified: Secondary | ICD-10-CM | POA: Diagnosis not present

## 2020-10-11 DIAGNOSIS — R059 Cough, unspecified: Secondary | ICD-10-CM | POA: Diagnosis not present

## 2020-10-11 DIAGNOSIS — R0982 Postnasal drip: Secondary | ICD-10-CM | POA: Diagnosis not present

## 2020-10-23 DIAGNOSIS — R7301 Impaired fasting glucose: Secondary | ICD-10-CM | POA: Diagnosis not present

## 2020-12-01 DIAGNOSIS — H903 Sensorineural hearing loss, bilateral: Secondary | ICD-10-CM | POA: Diagnosis not present

## 2021-02-05 DIAGNOSIS — H524 Presbyopia: Secondary | ICD-10-CM | POA: Diagnosis not present

## 2021-02-05 DIAGNOSIS — H2513 Age-related nuclear cataract, bilateral: Secondary | ICD-10-CM | POA: Diagnosis not present

## 2021-02-05 DIAGNOSIS — H5203 Hypermetropia, bilateral: Secondary | ICD-10-CM | POA: Diagnosis not present

## 2021-04-03 DIAGNOSIS — R197 Diarrhea, unspecified: Secondary | ICD-10-CM | POA: Diagnosis not present

## 2021-04-10 DIAGNOSIS — R197 Diarrhea, unspecified: Secondary | ICD-10-CM | POA: Diagnosis not present

## 2021-04-26 DIAGNOSIS — Z0000 Encounter for general adult medical examination without abnormal findings: Secondary | ICD-10-CM | POA: Diagnosis not present

## 2021-07-17 DIAGNOSIS — R3912 Poor urinary stream: Secondary | ICD-10-CM | POA: Diagnosis not present

## 2021-07-23 DIAGNOSIS — E890 Postprocedural hypothyroidism: Secondary | ICD-10-CM | POA: Diagnosis not present

## 2021-07-23 DIAGNOSIS — N183 Chronic kidney disease, stage 3 unspecified: Secondary | ICD-10-CM | POA: Diagnosis not present

## 2021-07-23 DIAGNOSIS — Z0000 Encounter for general adult medical examination without abnormal findings: Secondary | ICD-10-CM | POA: Diagnosis not present

## 2021-07-23 DIAGNOSIS — R7303 Prediabetes: Secondary | ICD-10-CM | POA: Diagnosis not present

## 2021-07-23 DIAGNOSIS — Z1211 Encounter for screening for malignant neoplasm of colon: Secondary | ICD-10-CM | POA: Diagnosis not present

## 2021-07-23 DIAGNOSIS — Z136 Encounter for screening for cardiovascular disorders: Secondary | ICD-10-CM | POA: Diagnosis not present

## 2021-07-26 DIAGNOSIS — Z1211 Encounter for screening for malignant neoplasm of colon: Secondary | ICD-10-CM | POA: Diagnosis not present

## 2021-09-24 DIAGNOSIS — G510 Bell's palsy: Secondary | ICD-10-CM | POA: Diagnosis not present

## 2021-11-19 DIAGNOSIS — E785 Hyperlipidemia, unspecified: Secondary | ICD-10-CM | POA: Diagnosis not present

## 2021-11-21 DIAGNOSIS — B880 Other acariasis: Secondary | ICD-10-CM | POA: Diagnosis not present

## 2022-01-17 DIAGNOSIS — L718 Other rosacea: Secondary | ICD-10-CM | POA: Diagnosis not present

## 2022-02-11 DIAGNOSIS — H2513 Age-related nuclear cataract, bilateral: Secondary | ICD-10-CM | POA: Diagnosis not present

## 2022-02-11 DIAGNOSIS — H5203 Hypermetropia, bilateral: Secondary | ICD-10-CM | POA: Diagnosis not present

## 2022-02-18 DIAGNOSIS — R42 Dizziness and giddiness: Secondary | ICD-10-CM | POA: Diagnosis not present

## 2022-02-21 ENCOUNTER — Ambulatory Visit: Payer: Medicare Other | Admitting: Internal Medicine

## 2022-02-21 ENCOUNTER — Encounter: Payer: Self-pay | Admitting: Internal Medicine

## 2022-02-21 VITALS — BP 156/81 | HR 63 | Temp 97.8°F | Resp 16 | Ht 70.0 in | Wt 204.0 lb

## 2022-02-21 DIAGNOSIS — R42 Dizziness and giddiness: Secondary | ICD-10-CM | POA: Diagnosis not present

## 2022-02-21 DIAGNOSIS — I10 Essential (primary) hypertension: Secondary | ICD-10-CM | POA: Diagnosis not present

## 2022-02-21 DIAGNOSIS — E782 Mixed hyperlipidemia: Secondary | ICD-10-CM | POA: Diagnosis not present

## 2022-02-21 MED ORDER — LISINOPRIL 10 MG PO TABS: 10.0000 mg | ORAL_TABLET | Freq: Every day | ORAL | 3 refills | Status: DC

## 2022-02-21 NOTE — Progress Notes (Signed)
Primary Physician/Referring:  Kathyrn Lass, MD  Patient ID: Devon Jacobs, male    DOB: Mar 16, 1941, 81 y.o.   MRN: [id number]  No chief complaint on file.  HPI:    Devon Jacobs  is a 81 y.o. male with past medical history significant for hyperlipidemia, hypothyroidism, and high blood pressure who has not been diagnosed with hypertension who is here to establish with cardiology.  Patient has been having dizziness and lightheadedness with activity and exertion lately.  He first noticed this happening when he is golfing.  Patient has recently also noted this occurring when he tries to workout, additionally when he is mowing his lawn and he has a very large lawn.  Patient has never had an echocardiogram or stress test in the past.  His blood pressure is very elevated today, he states it is due to whitecoat hypertension.  We will initiate low-dose lisinopril at this time.  If patient continues to lose weight and his echo and stress test come back normal, we can talk about discontinuing lisinopril at that time.  Continue on low-dose Crestor as cholesterol is very well controlled at this time.  Patient denies shortness of breath, chest pain, palpitations, diaphoresis, syncope, edema, orthopnea, PND.  Past Medical History:  Diagnosis Date   Eczema    Hernia    Hyperlipidemia    Hypothyroid    Rhinitis    Past Surgical History:  Procedure Laterality Date   bicep tendon rupture     BUNIONECTOMY     HERNIA REPAIR  9/13   bilat ing hernias   KNEE ARTHROSCOPY     left finger surgery     MECKEL DIVERTICULUM EXCISION     PAROTIDECTOMY  11/25/2011   Procedure: PAROTIDECTOMY;  Surgeon: Izora Gala, MD;  Location: [facility];  Service: ENT;  Laterality: Right;   SHOULDER SURGERY     toe removal     Family History  Problem Relation Age of Onset   Diabetes Mellitus I Mother    Diabetes Mellitus I Father    Cancer Sister    Diabetes Mellitus I Sister     Social History    Tobacco Use   Smoking status: Never   Smokeless tobacco: Not on file  Substance Use Topics   Alcohol use: Yes    Alcohol/week: 2.0 standard drinks of alcohol    Types: 2 Glasses of wine per week   Marital Status: Married  ROS  Review of Systems  Neurological:  Positive for dizziness and light-headedness.  Objective  Blood pressure (!) 156/81, pulse 63, temperature 97.8 F (36.6 C), temperature source Temporal, resp. rate 16, height 5' 10"  (1.778 m), weight 204 lb (92.5 kg), SpO2 93 %. Body mass index is 29.27 kg/m.     02/21/2022    2:49 PM 12/21/2017    3:04 PM 12/21/2017   12:09 PM  Vitals with BMI  Height 5' 10"     Weight 204 lbs    BMI 43.15    Systolic 400 867 619  Diastolic 81 76 93  Pulse 63 62 96     Physical Exam Vitals and nursing note reviewed.  Constitutional:      General: He is not in acute distress. Cardiovascular:     Rate and Rhythm: Normal rate and regular rhythm.     Pulses: Normal pulses.     Heart sounds: Normal heart sounds. No murmur heard. Pulmonary:     Effort: Pulmonary effort is normal.  Breath sounds: Normal breath sounds.  Abdominal:     General: Bowel sounds are normal.  Musculoskeletal:     Right lower leg: No edema.     Left lower leg: No edema.  Skin:    General: Skin is warm and dry.  Neurological:     Mental Status: He is alert.   Medications and allergies  No Known Allergies   Medication list after today's encounter   Current Outpatient Medications:    alfuzosin (UROXATRAL) 10 MG 24 hr tablet, Take 10 mg by mouth daily. , Disp: , Rfl: 11   cetirizine (ZYRTEC) 10 MG tablet, Take 10 mg by mouth daily as needed for allergies. , Disp: , Rfl:    ferrous sulfate 325 (65 FE) MG EC tablet, Take 325 mg by mouth daily with breakfast., Disp: , Rfl:    levothyroxine (SYNTHROID, LEVOTHROID) 112 MCG tablet, Take 112 mcg by mouth daily., Disp: , Rfl: 3   lisinopril (ZESTRIL) 10 MG tablet, Take 1 tablet (10 mg total) by mouth  daily., Disp: 90 tablet, Rfl: 3   rosuvastatin (CRESTOR) 5 MG tablet, Take 5 mg by mouth daily., Disp: , Rfl:    vitamin C (ASCORBIC ACID) 250 MG tablet, Take 250 mg by mouth daily., Disp: , Rfl:   Laboratory examination:   No results found for: "NA", "K", "CO2", "GLUCOSE", "BUN", "CREATININE", "CALCIUM", "EGFR", "GFRNONAA"      No data to display            Latest Ref Rng & Units 11/25/2011    7:44 AM  CBC  Hemoglobin 13.0 - 17.0 g/dL 14.5     Lipid Panel No results for input(s): "CHOL", "TRIG", "[place]", "VLDL", "HDL", "CHOLHDL", "LDLDIRECT" in the last 8760 hours.  HEMOGLOBIN A1C No results found for: "HGBA1C", "MPG" TSH No results for input(s): "TSH" in the last 8760 hours.  External labs:   02/19/2022 total cholesterol 135, triglycerides 95, HDL 50, LDL 67 Hemoglobin A1c 6.0 Hemoglobin 13.2, hematocrit 39.1, platelets 131 BUN 25, creatinine 1.31, EGFR 55, potassium 4.3 TSH 1.10  Radiology:    Cardiac Studies:   No results found for this or any previous visit from the past 1095 days.     No results found for this or any previous visit from the past 1095 days.     EKG:   02/21/2022 NSR with first degree AV block, normal axis, possible LVH, no evidence of ischemia  Assessment     ICD-10-CM   1. Dizziness  R42 EKG 12-Lead    PCV ECHOCARDIOGRAM COMPLETE    PCV MYOCARDIAL PERFUSION WO LEXISCAN    2. Essential hypertension  I10 PCV ECHOCARDIOGRAM COMPLETE    PCV MYOCARDIAL PERFUSION WO LEXISCAN    3. Mixed hyperlipidemia  E78.2 PCV ECHOCARDIOGRAM COMPLETE    PCV MYOCARDIAL PERFUSION WO LEXISCAN       Orders Placed This Encounter  Procedures   PCV MYOCARDIAL PERFUSION WO LEXISCAN    Standing Status:   Future    Standing Expiration Date:   04/23/2022   EKG 12-Lead   PCV ECHOCARDIOGRAM COMPLETE    Standing Status:   Future    Standing Expiration Date:   02/22/2023    Meds ordered this encounter  Medications   lisinopril (ZESTRIL) 10 MG  tablet    Sig: Take 1 tablet (10 mg total) by mouth daily.    Dispense:  90 tablet    Refill:  3    Medications Discontinued During This Encounter  Medication Reason  ibuprofen (ADVIL,MOTRIN) 200 MG tablet      Recommendations:   Devon Jacobs is a 81 y.o.  male with HTN, HLD, and dizziness   Dizziness Stress test and echo ordered   Essential hypertension Start low dose lisinopril   Mixed hyperlipidemia Continue Crestor      Floydene Flock, DO, [facility]  02/21/2022, 3:07 PM Office: [phone] Pager: [phone]

## 2022-02-25 ENCOUNTER — Ambulatory Visit: Payer: Medicare Other

## 2022-02-25 DIAGNOSIS — I10 Essential (primary) hypertension: Secondary | ICD-10-CM | POA: Diagnosis not present

## 2022-02-25 DIAGNOSIS — R42 Dizziness and giddiness: Secondary | ICD-10-CM

## 2022-02-25 DIAGNOSIS — E782 Mixed hyperlipidemia: Secondary | ICD-10-CM

## 2022-03-18 ENCOUNTER — Ambulatory Visit: Payer: Medicare Other

## 2022-03-18 DIAGNOSIS — I10 Essential (primary) hypertension: Secondary | ICD-10-CM

## 2022-03-18 DIAGNOSIS — E782 Mixed hyperlipidemia: Secondary | ICD-10-CM | POA: Diagnosis not present

## 2022-03-18 DIAGNOSIS — R42 Dizziness and giddiness: Secondary | ICD-10-CM | POA: Diagnosis not present

## 2022-04-08 ENCOUNTER — Ambulatory Visit: Payer: Medicare Other | Admitting: Internal Medicine

## 2022-04-08 ENCOUNTER — Encounter: Payer: Self-pay | Admitting: Internal Medicine

## 2022-04-08 VITALS — BP 129/69 | HR 74 | Ht 70.0 in | Wt 202.4 lb

## 2022-04-08 DIAGNOSIS — I351 Nonrheumatic aortic (valve) insufficiency: Secondary | ICD-10-CM | POA: Diagnosis not present

## 2022-04-08 DIAGNOSIS — E782 Mixed hyperlipidemia: Secondary | ICD-10-CM

## 2022-04-08 NOTE — Progress Notes (Signed)
Primary Physician/Referring:  Kathyrn Lass, MD  Patient ID: Devon Jacobs, male    DOB: 1940-12-14, 81 y.o.   MRN: [id number]  Chief Complaint  Patient presents with   Dizziness   Follow-up   Results    HPI:    Devon Jacobs  is a 81 y.o. male with past medical history significant for hyperlipidemia, hypothyroidism, and high blood pressure who is here for a follow-up visit. He has been doing well since the last time he was here. Patient does not feel any different on the lisinopril with respect to his aortic regurgitation and since his BP is controlled without medications he can stop taking it as it. Patient denies shortness of breath, chest pain, palpitations, diaphoresis, syncope, edema, orthopnea, PND.  Past Medical History:  Diagnosis Date   Eczema    Hernia    Hyperlipidemia    Hypothyroid    Rhinitis    Past Surgical History:  Procedure Laterality Date   bicep tendon rupture     BUNIONECTOMY     HERNIA REPAIR  9/13   bilat ing hernias   KNEE ARTHROSCOPY     left finger surgery     MECKEL DIVERTICULUM EXCISION     PAROTIDECTOMY  11/25/2011   Procedure: PAROTIDECTOMY;  Surgeon: Izora Gala, MD;  Location: [facility];  Service: ENT;  Laterality: Right;   SHOULDER SURGERY     toe removal     Family History  Problem Relation Age of Onset   Diabetes Mellitus I Mother    Diabetes Mellitus I Father    Cancer Sister    Diabetes Mellitus I Sister     Social History   Tobacco Use   Smoking status: Never   Smokeless tobacco: Not on file  Substance Use Topics   Alcohol use: Yes    Alcohol/week: 2.0 standard drinks of alcohol    Types: 2 Glasses of wine per week   Marital Status: Married  ROS  Review of Systems  Neurological:  Negative for dizziness and light-headedness.   Objective  Blood pressure 129/69, pulse 74, height _0  (1.778 m), weight 202 lb 6.4 oz (91.8 kg), SpO2 98 %. Body mass index is 29.04 kg/m.     04/08/2022   10:14 AM  02/21/2022    2:49 PM 12/21/2017    3:04 PM  Vitals with BMI  Height _1  _2    Weight 202 lbs 6 oz 204 lbs   BMI 67.12 45.80   Systolic 998 338 250  Diastolic 69 81 76  Pulse 74 63 62     Physical Exam Vitals and nursing note reviewed.  Constitutional:      General: He is not in acute distress. Cardiovascular:     Rate and Rhythm: Normal rate and regular rhythm.     Pulses: Normal pulses.     Heart sounds: Normal heart sounds. No murmur heard. Pulmonary:     Effort: Pulmonary effort is normal.     Breath sounds: Normal breath sounds.  Abdominal:     General: Bowel sounds are normal.  Musculoskeletal:     Right lower leg: No edema.     Left lower leg: No edema.  Skin:    General: Skin is warm and dry.  Neurological:     Mental Status: He is alert.    Medications and allergies  No Known Allergies   Medication list after today's encounter   Current Outpatient Medications:    alfuzosin (UROXATRAL)  10 MG 24 hr tablet, Take 10 mg by mouth daily. , Disp: , Rfl: 11   cetirizine (ZYRTEC) 10 MG tablet, Take 10 mg by mouth daily as needed for allergies. , Disp: , Rfl:    ferrous sulfate 325 (65 FE) MG EC tablet, Take 325 mg by mouth daily with breakfast., Disp: , Rfl:    levothyroxine (SYNTHROID, LEVOTHROID) 112 MCG tablet, Take 112 mcg by mouth daily., Disp: , Rfl: 3   lisinopril (ZESTRIL) 10 MG tablet, Take 1 tablet (10 mg total) by mouth daily., Disp: 90 tablet, Rfl: 3   rosuvastatin (CRESTOR) 5 MG tablet, Take 5 mg by mouth daily., Disp: , Rfl:    vitamin C (ASCORBIC ACID) 250 MG tablet, Take 250 mg by mouth daily., Disp: , Rfl:   Laboratory examination:   No results found for: "NA", "K", "CO2", "GLUCOSE", "BUN", "CREATININE", "CALCIUM", "EGFR", "GFRNONAA"      No data to display            Latest Ref Rng & Units 11/25/2011    7:44 AM  CBC  Hemoglobin 13.0 - 17.0 g/dL 14.5     Lipid Panel No results for input(s): "CHOL", "TRIG", "[place]", "VLDL", "HDL",  "CHOLHDL", "LDLDIRECT" in the last 8760 hours.  HEMOGLOBIN A1C No results found for: "HGBA1C", "MPG" TSH No results for input(s): "TSH" in the last 8760 hours.  External labs:   02/19/2022 total cholesterol 135, triglycerides 95, HDL 50, LDL 67 Hemoglobin A1c 6.0 Hemoglobin 13.2, hematocrit 39.1, platelets 131 BUN 25, creatinine 1.31, EGFR 55, potassium 4.3 TSH 1.10  Radiology:    Cardiac Studies:   Exercise Tetrofosmin stress test 03/19/2022: Exercise nuclear stress test was performed using Bruce protocol. Patient reached 6.2 METS, and 86% of age predicted maximum heart rate. Exercise capacity was low. No chest pain reported. normal heart rate response. Resting hypertension 150/90 mmHg, with peak BP of 210/70 mmHg. Stress EKG revealed no ischemic changes. Frequent PVC's during rest and recovery, improve with exercise. Decreased tracer uptake in inferior myocardium, more prominent at rest-likely due to diaphragmatic attenuation. No definite evidence of decreased perfusion. Stress LVEF 57%. No wall motion or thickening abnormality. Intermediate risk study due frequent PVC's.   Echocardiogram 02/25/2022:  Left ventricle cavity is normal in size. Mild concentric hypertrophy of  the left ventricle. Normal global wall motion. Normal LV systolic function  with EF 58%. Doppler evidence of grade I (impaired) diastolic dysfunction,  normal LAP.  Trileaflet aortic valve.  Moderate (Grade II) aortic regurgitation. Mild  aortic valve leaflet calcification.  Mild to moderate mitral regurgitation.  Mild pulmonic regurgitation.  No evidence of pulmonary hypertension.     EKG:   02/21/2022 NSR with first degree AV block, normal axis, possible LVH, no evidence of ischemia  Assessment     ICD-10-CM   1. Mixed hyperlipidemia  E78.2     2. 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   No orders of the defined  types were placed in this encounter.   There are no discontinued medications.    Recommendations:   Devon Jacobs is a 81 y.o.  male with HTN, HLD, and dizziness   Aortic regurgitation Moderate per echo Follow-up with annual echocardiogram    Mixed hyperlipidemia Continue Crestor Follow-up in 1 year or sooner if needed     Floydene Flock, DO, [facility]  04/08/2022, 11:44 AM Office: [phone] Pager: [phone]

## 2022-05-01 DIAGNOSIS — Z0000 Encounter for general adult medical examination without abnormal findings: Secondary | ICD-10-CM | POA: Diagnosis not present

## 2022-05-01 DIAGNOSIS — Z23 Encounter for immunization: Secondary | ICD-10-CM | POA: Diagnosis not present

## 2022-06-20 DIAGNOSIS — H0015 Chalazion left lower eyelid: Secondary | ICD-10-CM | POA: Diagnosis not present

## 2022-07-23 DIAGNOSIS — J069 Acute upper respiratory infection, unspecified: Secondary | ICD-10-CM | POA: Diagnosis not present

## 2022-07-29 DIAGNOSIS — R3912 Poor urinary stream: Secondary | ICD-10-CM | POA: Diagnosis not present

## 2022-07-30 DIAGNOSIS — E7800 Pure hypercholesterolemia, unspecified: Secondary | ICD-10-CM | POA: Diagnosis not present

## 2022-07-30 DIAGNOSIS — N1831 Chronic kidney disease, stage 3a: Secondary | ICD-10-CM | POA: Diagnosis not present

## 2022-07-30 DIAGNOSIS — R030 Elevated blood-pressure reading, without diagnosis of hypertension: Secondary | ICD-10-CM | POA: Diagnosis not present

## 2022-07-30 DIAGNOSIS — D696 Thrombocytopenia, unspecified: Secondary | ICD-10-CM | POA: Diagnosis not present

## 2022-07-30 DIAGNOSIS — E890 Postprocedural hypothyroidism: Secondary | ICD-10-CM | POA: Diagnosis not present

## 2022-07-30 DIAGNOSIS — R7303 Prediabetes: Secondary | ICD-10-CM | POA: Diagnosis not present

## 2022-07-30 DIAGNOSIS — Z23 Encounter for immunization: Secondary | ICD-10-CM | POA: Diagnosis not present

## 2022-08-27 DIAGNOSIS — L821 Other seborrheic keratosis: Secondary | ICD-10-CM | POA: Diagnosis not present

## 2022-08-27 DIAGNOSIS — L218 Other seborrheic dermatitis: Secondary | ICD-10-CM | POA: Diagnosis not present

## 2022-08-27 DIAGNOSIS — D492 Neoplasm of unspecified behavior of bone, soft tissue, and skin: Secondary | ICD-10-CM | POA: Diagnosis not present

## 2022-10-30 NOTE — Telephone Encounter (Signed)
From pt

## 2022-11-04 DIAGNOSIS — D649 Anemia, unspecified: Secondary | ICD-10-CM | POA: Diagnosis not present

## 2022-11-04 DIAGNOSIS — E890 Postprocedural hypothyroidism: Secondary | ICD-10-CM | POA: Diagnosis not present

## 2022-12-13 DIAGNOSIS — U071 COVID-19: Secondary | ICD-10-CM | POA: Diagnosis not present

## 2023-01-15 DIAGNOSIS — M79662 Pain in left lower leg: Secondary | ICD-10-CM | POA: Diagnosis not present

## 2023-01-16 DIAGNOSIS — I87392 Chronic venous hypertension (idiopathic) with other complications of left lower extremity: Secondary | ICD-10-CM | POA: Diagnosis not present

## 2023-01-16 DIAGNOSIS — M79662 Pain in left lower leg: Secondary | ICD-10-CM | POA: Diagnosis not present

## 2023-01-16 DIAGNOSIS — M7989 Other specified soft tissue disorders: Secondary | ICD-10-CM | POA: Diagnosis not present

## 2023-01-30 DIAGNOSIS — M7122 Synovial cyst of popliteal space [Baker], left knee: Secondary | ICD-10-CM | POA: Diagnosis not present

## 2023-02-04 DIAGNOSIS — R7303 Prediabetes: Secondary | ICD-10-CM | POA: Diagnosis not present

## 2023-02-04 DIAGNOSIS — Z23 Encounter for immunization: Secondary | ICD-10-CM | POA: Diagnosis not present

## 2023-02-04 DIAGNOSIS — E7800 Pure hypercholesterolemia, unspecified: Secondary | ICD-10-CM | POA: Diagnosis not present

## 2023-02-04 DIAGNOSIS — E890 Postprocedural hypothyroidism: Secondary | ICD-10-CM | POA: Diagnosis not present

## 2023-02-20 DIAGNOSIS — H2513 Age-related nuclear cataract, bilateral: Secondary | ICD-10-CM | POA: Diagnosis not present

## 2023-02-20 DIAGNOSIS — H5203 Hypermetropia, bilateral: Secondary | ICD-10-CM | POA: Diagnosis not present

## 2023-03-03 DIAGNOSIS — M7122 Synovial cyst of popliteal space [Baker], left knee: Secondary | ICD-10-CM | POA: Diagnosis not present

## 2023-04-09 ENCOUNTER — Ambulatory Visit: Payer: Medicare Other | Admitting: Internal Medicine

## 2023-04-09 ENCOUNTER — Ambulatory Visit: Payer: Self-pay | Admitting: Cardiology

## 2023-04-21 ENCOUNTER — Ambulatory Visit (INDEPENDENT_AMBULATORY_CARE_PROVIDER_SITE_OTHER): Payer: Medicare Other

## 2023-04-21 ENCOUNTER — Ambulatory Visit: Payer: Medicare Other | Attending: Internal Medicine | Admitting: Cardiology

## 2023-04-21 ENCOUNTER — Encounter: Payer: Self-pay | Admitting: Cardiology

## 2023-04-21 VITALS — BP 102/50 | HR 34 | Resp 16 | Ht 70.0 in | Wt 211.0 lb

## 2023-04-21 DIAGNOSIS — R0609 Other forms of dyspnea: Secondary | ICD-10-CM | POA: Diagnosis not present

## 2023-04-21 DIAGNOSIS — I493 Ventricular premature depolarization: Secondary | ICD-10-CM

## 2023-04-21 DIAGNOSIS — I351 Nonrheumatic aortic (valve) insufficiency: Secondary | ICD-10-CM

## 2023-04-21 DIAGNOSIS — R600 Localized edema: Secondary | ICD-10-CM | POA: Diagnosis not present

## 2023-04-21 DIAGNOSIS — E782 Mixed hyperlipidemia: Secondary | ICD-10-CM

## 2023-04-21 MED ORDER — FUROSEMIDE 20 MG PO TABS: 20.0000 mg | ORAL_TABLET | Freq: Every day | ORAL | 3 refills | Status: DC

## 2023-04-21 NOTE — Progress Notes (Signed)
Cardiology Office Note:  .   Date:  04/21/2023  ID:  [name], DOB 1940/08/15, MRN [id number] PCP: Sigmund Hazel, MD  [facility] Providers Cardiologist:  Truett Mainland, MD PCP: Sigmund Hazel, MD  Chief Complaint  Patient presents with   Mixed hyperlipidemia   Follow-up      History of Present Illness: [name]    JORAM LEIBY is a 82 y.o. male with hypertension, hyperlipidemia, hypothyroidism, aortic regurgitation  Patient was previously seen by Dr. Melton Alar, today is his first visit with me.  Over the last few weeks, patient has noticed exertional dyspnea, without chest pain.  He is also noticed swelling in the left leg, which she has attributed to having had Baker's cyst in the past.  He denies any palpitations, presyncope, syncope symptoms.   Vitals:   04/21/23 1459  BP: (!) 102/50  Pulse: (!) 34  Resp: 16  SpO2: 91%     ROS:  Review of Systems  Cardiovascular:  Positive for dyspnea on exertion and leg swelling. Negative for chest pain, palpitations and syncope.     Studies Reviewed: [name]        EKG 04/21/2023: Sinus rhythm with frequent Premature ventricular complexes in a pattern of bigeminy When compared with ECG of 17-Jun-2015 11:09, Premature ventricular complexes are new    Independently interpreted 01/2023: HbA1C 6.4% 07/2022: Chol 136, TG 6117, HDL 50, LDL 65 Hb 13  Echocardiogram 02/25/2022:  Left ventricle cavity is normal in size. Mild concentric hypertrophy of  the left ventricle. Normal global wall motion. Normal LV systolic function  with EF 58%. Doppler evidence of grade I (impaired) diastolic dysfunction,  normal LAP.  Trileaflet aortic valve.  Moderate (Grade II) aortic regurgitation. Mild  aortic valve leaflet calcification.  Mild to moderate mitral regurgitation.  Mild pulmonic regurgitation.  No evidence of pulmonary hypertension.     Physical Exam:   Physical Exam Vitals and nursing note reviewed.  Constitutional:       General: He is not in acute distress. Neck:     Vascular: No JVD.  Cardiovascular:     Rate and Rhythm: Regular rhythm. Bradycardia present.     Heart sounds: Murmur heard.     High-pitched blowing decrescendo early diastolic murmur is present with a grade of 2/4 at the upper right sternal border radiating to the apex.  Pulmonary:     Effort: Pulmonary effort is normal.     Breath sounds: Normal breath sounds. No wheezing or rales.  Musculoskeletal:     Right lower leg: Edema (1+) present.     Left lower leg: Edema (2+) present.      VISIT DIAGNOSES:   ICD-10-CM   1. Mixed hyperlipidemia  E78.2     2. Exertional dyspnea  R06.09 ECHOCARDIOGRAM COMPLETE    Pro b natriuretic peptide (BNP)    3. PVC's (premature ventricular contractions)  I49.3 Basic metabolic panel    Magnesium    LONG TERM MONITOR (3-14 DAYS)    4. Leg edema  R60.0 Pro b natriuretic peptide (BNP)    5. 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    HESSTON DOSCH is a 82 y.o. male with hypertension, hyperlipidemia, hypothyroidism, aortic regurgitation, PVC  Exertional dyspnea: New symptom.  Physical exam shows bilateral leg edema left greater than sign right.  While leg edema could also be due to venous insufficiency, in the  setting of his exertional dyspnea symptoms, ventricular bigeminy on EKG, I am concerned about possibility of heart failure. Recommend 2-week Zio patch to assess PVC burden, echocardiogram to assess systolic and diastolic function, and also aortic regurgitation. I added Lasix 20 mg daily. Recommend low-salt diet.  Bradycardia: Attributed to ventricular bigeminy, recommendations as above.     Meds ordered this encounter  Medications   furosemide (LASIX) 20 MG tablet    Sig: Take 1 tablet (20 mg total) by mouth daily.    Dispense:  90 tablet    Refill:  3     F/u in 4-6 weeks  Signed, Elder Negus, MD

## 2023-04-21 NOTE — Progress Notes (Unsigned)
ZIO serial # [id number] from office inventory applied to patient.

## 2023-04-21 NOTE — Patient Instructions (Addendum)
Medication Instructions:  Your physician has recommended you make the following change in your medication:   1) START furosemide (Lasix) 20 mg daily  *If you need a refill on your cardiac medications before your next appointment, please call your pharmacy*  Lab Work: TODAY: BMP, BNP, Magnesium If you have labs (blood work) drawn today and your tests are completely normal, you will receive your results only by: MyChart Message (if you have MyChart) OR A paper copy in the mail If you have any lab test that is abnormal or we need to change your treatment, we will call you to review the results.  Testing/Procedures: Your physician has requested that you wear a Zio heart monitor for 14 days. Please allow 2 weeks after returning the heart monitor before our office calls you with the results.   Your physician has requested that you have an echocardiogram. Echocardiography is a painless test that uses sound waves to create images of your heart. It provides your doctor with information about the size and shape of your heart and how well your heart's chambers and valves are working. This procedure takes approximately one hour. There are no restrictions for this procedure. Please do NOT wear cologne, perfume, aftershave, or lotions (deodorant is allowed). Please arrive 15 minutes prior to your appointment time.  Please note: We ask at that you not bring children with you during ultrasound (echo/ vascular) testing. Due to room size and safety concerns, children are not allowed in the ultrasound rooms during exams. Our front office staff cannot provide observation of children in our lobby area while testing is being conducted. An adult accompanying a patient to their appointment will only be allowed in the ultrasound room at the discretion of the ultrasound technician under special circumstances. We apologize for any inconvenience.  Follow-Up: At [facility], you and your health needs are our priority.   As part of our continuing mission to provide you with exceptional heart care, we have created designated Provider Care Teams.  These Care Teams include your primary Cardiologist (physician) and Advanced Practice Providers (APPs -  Physician Assistants and Nurse Practitioners) who all work together to provide you with the care you need, when you need it.  Your next appointment:   6 week(s)  The format for your next appointment:   In Person  Provider:   Elder Negus, MD  or Jari Favre, PA-C, Ronie Spies, PA-C, Robin Searing, NP, Jacolyn Reedy, PA-C, Eligha Bridegroom, NP, Tereso Newcomer, PA-C, or Perlie Gold, PA-C     Other Instructions Devon Jacobs- Long Term Monitor Instructions     Your physician has requested you wear a ZIO patch monitor for 14 days.  This is a single patch monitor. [employer] supplies one patch monitor per enrollment. Additional  stickers are not available. Please do not apply patch if you will be having a Nuclear Stress Test,  Echocardiogram, Cardiac CT, MRI, or Chest Xray during the period you would be wearing the  monitor. The patch cannot be worn during these tests. You cannot remove and re-apply the  ZIO XT patch monitor.  Your ZIO patch monitor will be mailed 3 day USPS to your address on file. It may take 3-5 days  to receive your monitor after you have been enrolled.  Once you have received your monitor, please review the enclosed instructions. Your monitor  has already been registered assigning a specific monitor serial # to you.     Billing and Patient Assistance Program Information  We have supplied [employer] with any of your insurance information on file for billing purposes.  [employer] offers a sliding scale Patient Assistance Program for patients that do not have  insurance, or whose insurance does not completely cover the cost of the ZIO monitor.  You must apply for the Patient Assistance Program to qualify for this discounted rate.  To apply, please call  [employer] at [phone], select option 4, select option 2, ask to apply for  Patient Assistance Program. Meredeth Ide will ask your household income, and how many people  are in your household. They will quote your out-of-pocket cost based on that information.  [employer] will also be able to set up a 36-month, interest-free payment plan if needed.     Applying the monitor     Shave hair from upper left chest.  Hold abrader disc by orange tab. Rub abrader in 40 strokes over the upper left chest as  indicated in your monitor instructions.  Clean area with 4 enclosed alcohol pads. Let dry.  Apply patch as indicated in monitor instructions. Patch will be placed under collarbone on left  side of chest with arrow pointing upward.  Rub patch adhesive wings for 2 minutes. Remove white label marked "1". Remove the white  label marked "2". Rub patch adhesive wings for 2 additional minutes.  While looking in a mirror, press and release button in center of patch. A small green light will  flash 3-4 times. This will be your only indicator that the monitor has been turned on.  Do not shower for the first 24 hours. You may shower after the first 24 hours.  Press the button if you feel a symptom. You will hear a small click. Record Date, Time and  Symptom in the Patient Logbook.  When you are ready to remove the patch, follow instructions on the last 2 pages of Patient  Logbook. Stick patch monitor onto the last page of Patient Logbook.  Place Patient Logbook in the blue and white box. Use locking tab on box and tape box closed  securely. The blue and white box has prepaid postage on it. Please place it in the mailbox as  soon as possible. Your physician should have your test results approximately 7 days after the  monitor has been mailed back to [facility].  Call [facility] Customer Care at [phone] if you have questions regarding  your ZIO XT patch monitor. Call them immediately if you see an  orange light blinking on your  monitor.  If your monitor falls off in less than 4 days, contact our Monitor department at [phone].  If your monitor becomes loose or falls off after 4 days call [employer] at [phone] for  suggestions on securing your monitor.

## 2023-04-22 LAB — BASIC METABOLIC PANEL
BUN/Creatinine Ratio: 20 (ref 10–24)
BUN: 26 mg/dL (ref 8–27)
CO2: 23 mmol/L (ref 20–29)
Calcium: 9.3 mg/dL (ref 8.6–10.2)
Chloride: 107 mmol/L — ABNORMAL HIGH (ref 96–106)
Creatinine, Ser: 1.29 mg/dL — ABNORMAL HIGH (ref 0.76–1.27)
Glucose: 103 mg/dL — ABNORMAL HIGH (ref 70–99)
Potassium: 4.2 mmol/L (ref 3.5–5.2)
Sodium: 143 mmol/L (ref 134–144)
eGFR: 55 mL/min/{1.73_m2} — ABNORMAL LOW (ref >59)

## 2023-04-22 LAB — PRO B NATRIURETIC PEPTIDE: NT-Pro BNP: 876 pg/mL — ABNORMAL HIGH (ref 0–486)

## 2023-04-22 LAB — MAGNESIUM: Magnesium: 2.2 mg/dL (ref 1.6–2.3)

## 2023-05-06 DIAGNOSIS — Z0000 Encounter for general adult medical examination without abnormal findings: Secondary | ICD-10-CM | POA: Diagnosis not present

## 2023-05-06 DIAGNOSIS — E890 Postprocedural hypothyroidism: Secondary | ICD-10-CM | POA: Diagnosis not present

## 2023-05-09 DIAGNOSIS — I493 Ventricular premature depolarization: Secondary | ICD-10-CM | POA: Diagnosis not present

## 2023-05-21 ENCOUNTER — Ambulatory Visit (HOSPITAL_COMMUNITY): Payer: Medicare Other | Attending: Cardiology

## 2023-05-21 DIAGNOSIS — R0609 Other forms of dyspnea: Secondary | ICD-10-CM | POA: Diagnosis not present

## 2023-05-21 DIAGNOSIS — I351 Nonrheumatic aortic (valve) insufficiency: Secondary | ICD-10-CM | POA: Insufficient documentation

## 2023-05-21 LAB — ECHOCARDIOGRAM COMPLETE
Area-P 1/2: 2.95 cm2
S' Lateral: 3.9 cm

## 2023-05-23 ENCOUNTER — Encounter: Payer: Self-pay | Admitting: Cardiology

## 2023-05-23 ENCOUNTER — Telehealth: Payer: Self-pay | Admitting: Cardiology

## 2023-05-23 MED ORDER — METOPROLOL SUCCINATE ER 25 MG PO TB24: 25.0000 mg | ORAL_TABLET | Freq: Every day | ORAL | 1 refills | Status: DC

## 2023-05-23 NOTE — Telephone Encounter (Signed)
 Me     05/23/23  1:20 PM Result Note Updated in appt notes to do an EKG at his upcoming office visit with Jari Favre PA-C on 06/03/23.

## 2023-05-23 NOTE — Telephone Encounter (Signed)
 The patient has been notified of the result and verbalized understanding.  All questions (if any) were answered.  Pt is aware that we will order for him to take Toprol  XL 25 mg po daily.    Advised him to keep his follow-up appt as scheduled with Orren Fabry PA-C, this month.  Confirmed the pharmacy of choice with the pt.   Pt verbalized understanding and agrees with this plan.

## 2023-05-23 NOTE — Telephone Encounter (Signed)
 Pt stated he's returning MD call regarding results and is requesting a callback from MD or nurse. Please advise

## 2023-05-23 NOTE — Telephone Encounter (Signed)
-----   Message from Nurse Roxie PARAS sent at 05/23/2023  8:16 AM EST -----  ----- Message ----- From: Elmira Newman PARAS, MD Sent: 05/22/2023   4:34 PM EST To: Orren LOISE Fabry, PA-C; Cv Div Ch St Triage  Normal heart function. Mild dilatation likely due to frequent PVCs (extra beats from bottom of the heart). Recommend started metoprolol  succinate 25 mg daily. Keep f/u later this month w/Tessa Fabry, recommend repeat EKG at that time. If PVC's persist, recommend EP referral at that time for symptomatic PVCs.  Thanks MJP

## 2023-05-26 NOTE — Telephone Encounter (Signed)
 Agree with the above recommendation. No further action needed.  Thanks MJP

## 2023-06-03 ENCOUNTER — Encounter: Payer: Self-pay | Admitting: Physician Assistant

## 2023-06-03 ENCOUNTER — Ambulatory Visit: Payer: Medicare Other | Attending: Physician Assistant | Admitting: Physician Assistant

## 2023-06-03 VITALS — BP 128/68 | HR 61 | Ht 70.0 in | Wt 210.8 lb

## 2023-06-03 DIAGNOSIS — R0609 Other forms of dyspnea: Secondary | ICD-10-CM

## 2023-06-03 MED ORDER — METOPROLOL SUCCINATE ER 25 MG PO TB24: 12.5000 mg | ORAL_TABLET | Freq: Every day | ORAL | 3 refills | Status: DC

## 2023-06-03 NOTE — Progress Notes (Signed)
Cardiology Office Note:  .   Date:  06/03/2023  ID:  Devon Jacobs, DOB 1941/03/11, MRN [id number] PCP: Sigmund Hazel, MD  [facility] Providers Cardiologist:  Elder Negus, MD {  History of Present Illness: Devon Jacobs   Devon Jacobs is a 83 y.o. male with a past medical history of hypertension, hyperlipidemia, hypothyroidism, and aortic regurgitation here for follow-up appointment.  Was previously seen by Dr. Rosemary Holms and he had been experiencing some exertional dyspnea without chest pain.  Noticing some swelling in his left leg which was attributed to having a Baker's cyst in the past.  Denied palpitation, presyncope, and syncope symptoms.  Today, he presents with a history of frequent PVCs and hypertension for a follow-up visit. He reports no noticeable extra or skipped heartbeats. He has been taking half a pill of metoprolol daily, which he believes has been effective in managing his blood pressure. He has considered increasing the dosage to a whole pill, but after discussion with the doctor, he decided to continue with the current dosage. He also takes Lasix daily, but reports no noticeable effects such as increased urination or reduction in swelling. He has not noticed any swelling in his feet.  The patient also has a history of COVID-19 infection, after which he noticed a wheezing effect and difficulty taking deep breaths during exercise. He suspects this could be a long-term effect of the infection, but he is not certain. He has not noticed any other significant changes in his health since the infection.  The patient also mentioned wearing a heart monitor for 14 days, but he did not understand the results, which included graphs and charts. He expressed a desire for a more detailed explanation of the results.We reviewed results in detail.   Reports no shortness of breath nor dyspnea on exertion. Reports no chest pain, pressure, or tightness. No edema, orthopnea, PND. Reports no  palpitations.   Discussed the use of AI scribe software for clinical note transcription with the patient, who gave verbal consent to proceed.  ROS: pertinent ROS in HPI  Studies Reviewed: .        Echocardiogram 02/25/2022:  Left ventricle cavity is normal in size. Mild concentric hypertrophy of  the left ventricle. Normal global wall motion. Normal LV systolic function  with EF 58%. Doppler evidence of grade I (impaired) diastolic dysfunction,  normal LAP.  Trileaflet aortic valve.  Moderate (Grade II) aortic regurgitation. Mild  aortic valve leaflet calcification.  Mild to moderate mitral regurgitation.  Mild pulmonic regurgitation.  No evidence of pulmonary hypertension.          Physical Exam:   VS:  BP 128/68   Pulse 61   Ht 5\' 10"  (1.778 m)   Wt 210 lb 12.8 oz (95.6 kg)   SpO2 96%   BMI 30.25 kg/m    Wt Readings from Last 3 Encounters:  06/03/23 210 lb 12.8 oz (95.6 kg)  04/21/23 211 lb (95.7 kg)  04/08/22 202 lb 6.4 oz (91.8 kg)    GEN: Well nourished, well developed in no acute distress NECK: No JVD; No carotid bruits CARDIAC: RRR, no murmurs, rubs, gallops RESPIRATORY:  Clear to auscultation without rales, wheezing or rhonchi  ABDOMEN: Soft, non-tender, non-distended EXTREMITIES:  No edema; No deformity   ASSESSMENT AND PLAN: .     Premature Ventricular Contractions (PVCs) No PVCs detected on auscultation today. Previously documented frequent PVCs with mild dilation of the left ventricle. No symptoms reported by the patient. -Continue  Metoprolol 12.5mg  daily.  Hypertension Blood pressure controlled. Patient considering increasing Metoprolol dose to 25mg  daily. -Advised to continue Metoprolol 12.5mg  daily due to current controlled blood pressure and heart rate at the lower end of normal.  Volume overload No current signs of fluid retention. Patient unsure of Lasix effectiveness. -Change Lasix 20mg  to as needed. Use if weight increases 1-2 pounds overnight  or if fluid retention in legs is noticed.  Possible Exercise-Induced Asthma Patient reports wheezing during home exercises, possibly related to previous COVID-19 infection. No wheezing detected on auscultation today. -No immediate action. Monitor symptoms.  General Health Maintenance / Followup Plans -No labs or EKG needed today. -Follow-up as needed. Dispo: 3 months   Signed, Sharlene Dory, PA-C

## 2023-06-03 NOTE — Patient Instructions (Signed)
Medication Instructions:  Continue taking metoporol succinate 12.5 mg once daily  *If you need a refill on your cardiac medications before your next appointment, please call your pharmacy*   Follow-Up: At [facility], you and your health needs are our priority.  As part of our continuing mission to provide you with exceptional heart care, we have created designated Provider Care Teams.  These Care Teams include your primary Cardiologist (physician) and Advanced Practice Providers (APPs -  Physician Assistants and Nurse Practitioners) who all work together to provide you with the care you need, when you need it.  We recommend signing up for the patient portal called "MyChart".  Sign up information is provided on this After Visit Summary.  MyChart is used to connect with patients for Virtual Visits (Telemedicine).  Patients are able to view lab/test results, encounter notes, upcoming appointments, etc.  Non-urgent messages can be sent to your provider as well.   To learn more about what you can do with MyChart, go to [email or web address].    Your next appointment:   3 month(s)  Provider:   Elder Negus, MD

## 2023-07-28 DIAGNOSIS — N1831 Chronic kidney disease, stage 3a: Secondary | ICD-10-CM | POA: Diagnosis not present

## 2023-07-28 DIAGNOSIS — E7800 Pure hypercholesterolemia, unspecified: Secondary | ICD-10-CM | POA: Diagnosis not present

## 2023-07-28 DIAGNOSIS — E890 Postprocedural hypothyroidism: Secondary | ICD-10-CM | POA: Diagnosis not present

## 2023-07-28 DIAGNOSIS — Z09 Encounter for follow-up examination after completed treatment for conditions other than malignant neoplasm: Secondary | ICD-10-CM | POA: Diagnosis not present

## 2023-07-28 DIAGNOSIS — I493 Ventricular premature depolarization: Secondary | ICD-10-CM | POA: Diagnosis not present

## 2023-07-28 DIAGNOSIS — D696 Thrombocytopenia, unspecified: Secondary | ICD-10-CM | POA: Diagnosis not present

## 2023-07-28 DIAGNOSIS — R7303 Prediabetes: Secondary | ICD-10-CM | POA: Diagnosis not present

## 2023-08-18 DIAGNOSIS — R3912 Poor urinary stream: Secondary | ICD-10-CM | POA: Diagnosis not present

## 2023-08-26 DIAGNOSIS — E7800 Pure hypercholesterolemia, unspecified: Secondary | ICD-10-CM | POA: Diagnosis not present

## 2023-08-26 DIAGNOSIS — E119 Type 2 diabetes mellitus without complications: Secondary | ICD-10-CM | POA: Diagnosis not present

## 2023-08-26 DIAGNOSIS — N1831 Chronic kidney disease, stage 3a: Secondary | ICD-10-CM | POA: Diagnosis not present

## 2023-09-11 ENCOUNTER — Encounter: Payer: Self-pay | Admitting: Cardiology

## 2023-09-11 ENCOUNTER — Ambulatory Visit: Payer: Medicare Other | Attending: Cardiology | Admitting: Cardiology

## 2023-09-11 VITALS — BP 122/68 | HR 79 | Ht 70.0 in | Wt 201.8 lb

## 2023-09-11 DIAGNOSIS — I10 Essential (primary) hypertension: Secondary | ICD-10-CM

## 2023-09-11 DIAGNOSIS — I493 Ventricular premature depolarization: Secondary | ICD-10-CM | POA: Diagnosis not present

## 2023-09-11 NOTE — Progress Notes (Signed)
  Cardiology Office Note:  .   Date:  09/11/2023  ID:  Devon Jacobs, DOB 1940-10-22, MRN [id number] PCP: Perley Bradley, MD  [facility] Providers Cardiologist:  Fransico Ivy, MD PCP: Perley Bradley, MD  Chief Complaint  Patient presents with   PVC      History of Present Illness: Devon Jacobs    Devon Jacobs is a 83 y.o. male with hypertension, hyperlipidemia, hypothyroidism, PVCs  Patient did not start taking metoprolol  as he was concerned about the side effects. That said, he has been feeling better. Leg edema has resolved.    Vitals:   09/11/23 0938  BP: 122/68  Pulse: 79  SpO2: 96%     ROS:  Review of Systems  Cardiovascular:  Positive for dyspnea on exertion and leg swelling. Negative for chest pain, palpitations and syncope.     Studies Reviewed: Devon Jacobs        EKG 09/11/2023: Sinus rhythm with occasional Premature ventricular complexes When compared with ECG of 21-Apr-2023 15:00,  PVCs are less frequent       Echocardiogram 05/21/2023: Mild LV/RV cavity dilatation. Mild LVH. LVEF 55-60%. Indeterminate diastolic function. Mod RA cavity dilatation. Aortic root 40 mm,    Zio patch monitor 13 days 04/21/2023 - 05/05/2023: Dominant rhythm: Sinus. HR 49-117 bpm. Avg HR 64 bpm, in sinus rhythm w/first degree AV block. 76 episodes of VT, fastest at 158 bpm for 5 beats, longest for 10 beats at 111 bpm. 17.8% isolated VE, <1% couplet/triplets. 3 episodes of atrial tachycardia, fastest at 150 bpm for 9 beats, longest for 20 beats at 119 bpm. <1% isolated SVE, couplet/triplets. 1 Pause occurred lasting 3.2 secs at 5:02 PM, in between episodes of ventricular bigeminy. No atrial fibrillation/atrial flutter/high grade AV block. 0 patient triggered events.    Independently interpreted 04/2023: ProBNP 876  01/2023: HbA1C 6.4% 07/2022: Chol 136, TG 6117, HDL 50, LDL 65 Hb 13   Physical Exam:   Physical Exam Vitals and nursing note reviewed.  Constitutional:       General: He is not in acute distress. Neck:     Vascular: No JVD.  Cardiovascular:     Rate and Rhythm: Regular rhythm. Bradycardia present.     Heart sounds: Murmur heard.     High-pitched blowing decrescendo early diastolic murmur is present with a grade of 2/4 at the upper right sternal border radiating to the apex.  Pulmonary:     Effort: Pulmonary effort is normal.     Breath sounds: Normal breath sounds. No wheezing or rales.  Musculoskeletal:     Right lower leg: Edema (1+) present.     Left lower leg: Edema (2+) present.     VISIT DIAGNOSES:   ICD-10-CM   1. Essential hypertension  I10 EKG 12-Lead    2. PVC (premature ventricular contraction)  I49.3 ECHOCARDIOGRAM COMPLETE        ASSESSMENT AND PLAN: .    Devon Jacobs is a 83 y.o. male with hypertension, hyperlipidemia, hypothyroidism, aortic regurgitation, PVC  Exertional dyspnea: Improved. LV RV cavity mildly dilated, but EF is preserved. PVCs only occasional on today's EKG even without starting metoprolol . Recommend repeat echocardiogram in 1 year.  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    Hypertension: Controlled  F/u in 4-6 weeks  Signed, Cody Das, MD

## 2023-09-11 NOTE — Patient Instructions (Signed)
 Medication Instructions:  Stop metoprolol    *If you need a refill on your cardiac medications before your next appointment, please call your pharmacy*  Testing/Procedures: Echo in 1 year   Your physician has requested that you have an echocardiogram. Echocardiography is a painless test that uses sound waves to create images of your heart. It provides your doctor with information about the size and shape of your heart and how well your heart's chambers and valves are working. This procedure takes approximately one hour. There are no restrictions for this procedure. Please do NOT wear cologne, perfume, aftershave, or lotions (deodorant is allowed). Please arrive 15 minutes prior to your appointment time.  Please note: We ask at that you not bring children with you during ultrasound (echo/ vascular) testing. Due to room size and safety concerns, children are not allowed in the ultrasound rooms during exams. Our front office staff cannot provide observation of children in our lobby area while testing is being conducted. An adult accompanying a patient to their appointment will only be allowed in the ultrasound room at the discretion of the ultrasound technician under special circumstances. We apologize for any inconvenience.   Follow-Up: At [facility], you and your health needs are our priority.  As part of our continuing mission to provide you with exceptional heart care, our providers are all part of one team.  This team includes your primary Cardiologist (physician) and Advanced Practice Providers or APPs (Physician Assistants and Nurse Practitioners) who all work together to provide you with the care you need, when you need it.  Your next appointment:   1 year(s)  Provider:   Cody Das, MD

## 2023-10-28 DIAGNOSIS — E119 Type 2 diabetes mellitus without complications: Secondary | ICD-10-CM | POA: Diagnosis not present

## 2023-11-04 DIAGNOSIS — I493 Ventricular premature depolarization: Secondary | ICD-10-CM | POA: Diagnosis not present

## 2023-11-04 DIAGNOSIS — E890 Postprocedural hypothyroidism: Secondary | ICD-10-CM | POA: Diagnosis not present

## 2023-11-04 DIAGNOSIS — E785 Hyperlipidemia, unspecified: Secondary | ICD-10-CM | POA: Diagnosis not present

## 2023-11-04 DIAGNOSIS — E119 Type 2 diabetes mellitus without complications: Secondary | ICD-10-CM | POA: Diagnosis not present

## 2023-11-06 DIAGNOSIS — E119 Type 2 diabetes mellitus without complications: Secondary | ICD-10-CM | POA: Diagnosis not present

## 2024-01-06 DIAGNOSIS — Z0000 Encounter for general adult medical examination without abnormal findings: Secondary | ICD-10-CM | POA: Diagnosis not present

## 2024-01-06 DIAGNOSIS — Z23 Encounter for immunization: Secondary | ICD-10-CM | POA: Diagnosis not present

## 2024-01-19 DIAGNOSIS — N1831 Chronic kidney disease, stage 3a: Secondary | ICD-10-CM | POA: Diagnosis not present

## 2024-01-19 DIAGNOSIS — E785 Hyperlipidemia, unspecified: Secondary | ICD-10-CM | POA: Diagnosis not present

## 2024-01-19 DIAGNOSIS — E890 Postprocedural hypothyroidism: Secondary | ICD-10-CM | POA: Diagnosis not present

## 2024-01-19 DIAGNOSIS — E1122 Type 2 diabetes mellitus with diabetic chronic kidney disease: Secondary | ICD-10-CM | POA: Diagnosis not present

## 2024-01-26 DIAGNOSIS — R2242 Localized swelling, mass and lump, left lower limb: Secondary | ICD-10-CM | POA: Diagnosis not present

## 2024-01-29 DIAGNOSIS — E1122 Type 2 diabetes mellitus with diabetic chronic kidney disease: Secondary | ICD-10-CM | POA: Diagnosis not present

## 2024-01-29 DIAGNOSIS — E890 Postprocedural hypothyroidism: Secondary | ICD-10-CM | POA: Diagnosis not present

## 2024-02-13 ENCOUNTER — Other Ambulatory Visit: Payer: Self-pay | Admitting: Cardiology

## 2024-02-26 DIAGNOSIS — H52203 Unspecified astigmatism, bilateral: Secondary | ICD-10-CM | POA: Diagnosis not present

## 2024-02-26 DIAGNOSIS — H2513 Age-related nuclear cataract, bilateral: Secondary | ICD-10-CM | POA: Diagnosis not present

## 2024-02-26 DIAGNOSIS — H5203 Hypermetropia, bilateral: Secondary | ICD-10-CM | POA: Diagnosis not present

## 2024-09-10 ENCOUNTER — Other Ambulatory Visit (HOSPITAL_COMMUNITY)
